# Patient Record
Sex: Female | Born: 1960 | Race: White | Hispanic: No | Marital: Married | Smoking: Current every day smoker
Health system: Southern US, Community
[De-identification: ages and names within clinical notes are randomized; demographics above are authoritative.]

## PROBLEM LIST (undated history)

## (undated) DIAGNOSIS — M1990 Unspecified osteoarthritis, unspecified site: Secondary | ICD-10-CM

## (undated) DIAGNOSIS — E785 Hyperlipidemia, unspecified: Secondary | ICD-10-CM

## (undated) DIAGNOSIS — I10 Essential (primary) hypertension: Secondary | ICD-10-CM

## (undated) DIAGNOSIS — F411 Generalized anxiety disorder: Secondary | ICD-10-CM

## (undated) DIAGNOSIS — E119 Type 2 diabetes mellitus without complications: Secondary | ICD-10-CM

## (undated) DIAGNOSIS — F329 Major depressive disorder, single episode, unspecified: Secondary | ICD-10-CM

## (undated) DIAGNOSIS — K5792 Diverticulitis of intestine, part unspecified, without perforation or abscess without bleeding: Secondary | ICD-10-CM

## (undated) DIAGNOSIS — K219 Gastro-esophageal reflux disease without esophagitis: Secondary | ICD-10-CM

## (undated) DIAGNOSIS — K2210 Ulcer of esophagus without bleeding: Secondary | ICD-10-CM

## (undated) DIAGNOSIS — F32A Depression, unspecified: Secondary | ICD-10-CM

## (undated) HISTORY — DX: Ulcer of esophagus without bleeding: K22.10

## (undated) HISTORY — DX: Gastro-esophageal reflux disease without esophagitis: K21.9

## (undated) HISTORY — DX: Diverticulitis of intestine, part unspecified, without perforation or abscess without bleeding: K57.92

## (undated) HISTORY — DX: Major depressive disorder, single episode, unspecified: F32.9

## (undated) HISTORY — DX: Generalized anxiety disorder: F41.1

## (undated) HISTORY — DX: Hyperlipidemia, unspecified: E78.5

## (undated) HISTORY — DX: Depression, unspecified: F32.A

## (undated) HISTORY — DX: Unspecified osteoarthritis, unspecified site: M19.90

---

## 1987-04-06 HISTORY — PX: GALLBLADDER SURGERY: SHX652

## 1992-04-05 HISTORY — PX: APPENDECTOMY: SHX54

## 2008-10-23 ENCOUNTER — Ambulatory Visit: Payer: Self-pay | Admitting: Unknown Physician Specialty

## 2009-11-26 ENCOUNTER — Ambulatory Visit: Payer: Self-pay | Admitting: Unknown Physician Specialty

## 2010-09-03 ENCOUNTER — Ambulatory Visit: Payer: Self-pay | Admitting: Unknown Physician Specialty

## 2010-10-26 ENCOUNTER — Ambulatory Visit: Payer: Self-pay | Admitting: Gastroenterology

## 2010-10-28 LAB — PATHOLOGY REPORT

## 2010-11-23 ENCOUNTER — Observation Stay: Payer: Self-pay | Admitting: Family Medicine

## 2010-11-30 ENCOUNTER — Ambulatory Visit: Payer: Self-pay | Admitting: Unknown Physician Specialty

## 2010-12-08 ENCOUNTER — Ambulatory Visit: Payer: Self-pay | Admitting: Gastroenterology

## 2010-12-14 ENCOUNTER — Ambulatory Visit: Payer: Self-pay | Admitting: Gastroenterology

## 2010-12-17 LAB — PATHOLOGY REPORT

## 2011-10-20 ENCOUNTER — Ambulatory Visit: Payer: Self-pay | Admitting: Unknown Physician Specialty

## 2012-01-14 ENCOUNTER — Ambulatory Visit: Payer: Self-pay | Admitting: Gastroenterology

## 2012-02-24 ENCOUNTER — Ambulatory Visit: Payer: Self-pay | Admitting: Obstetrics and Gynecology

## 2012-02-24 LAB — COMPREHENSIVE METABOLIC PANEL
Albumin: 4.2 g/dL (ref 3.4–5.0)
Anion Gap: 7 (ref 7–16)
BUN: 12 mg/dL (ref 7–18)
Calcium, Total: 9.1 mg/dL (ref 8.5–10.1)
Chloride: 103 mmol/L (ref 98–107)
EGFR (African American): >60
Glucose: 246 mg/dL — ABNORMAL HIGH (ref 65–99)
SGOT(AST): 54 U/L — ABNORMAL HIGH (ref 15–37)
SGPT (ALT): 57 U/L (ref 12–78)
Total Protein: 7.9 g/dL (ref 6.4–8.2)

## 2012-02-24 LAB — CBC
HGB: 12.7 g/dL (ref 12.0–16.0)
MCH: 32.7 pg (ref 26.0–34.0)
MCHC: 34.4 g/dL (ref 32.0–36.0)
MCV: 95 fL (ref 80–100)
Platelet: 181 10*3/uL (ref 150–440)
RBC: 3.88 10*6/uL (ref 3.80–5.20)
RDW: 12.7 % (ref 11.5–14.5)

## 2012-02-24 LAB — URINALYSIS, COMPLETE
Bacteria: NONE SEEN
Glucose,UR: >=500 mg/dL (ref 0–75)
Nitrite: NEGATIVE
Protein: NEGATIVE
RBC,UR: 1 /HPF (ref 0–5)
Specific Gravity: 1.003 (ref 1.003–1.030)
WBC UR: 2 /HPF (ref 0–5)

## 2012-02-24 LAB — PREGNANCY, URINE: Pregnancy Test, Urine: NEGATIVE m[IU]/mL

## 2012-03-07 ENCOUNTER — Ambulatory Visit: Payer: Self-pay | Admitting: Obstetrics and Gynecology

## 2012-03-07 LAB — HEMOGLOBIN: HGB: 10.6 g/dL — ABNORMAL LOW (ref 12.0–16.0)

## 2013-01-19 ENCOUNTER — Ambulatory Visit: Payer: Self-pay | Admitting: Physician Assistant

## 2013-01-26 ENCOUNTER — Ambulatory Visit: Payer: Self-pay | Admitting: Physician Assistant

## 2013-08-10 ENCOUNTER — Ambulatory Visit: Payer: Self-pay | Admitting: Physician Assistant

## 2014-07-23 NOTE — Op Note (Signed)
PATIENT NAME:  Morgan Small, Morgan Small MR#:  [id number] DATE OF BIRTH:  1960/12/22  DATE OF PROCEDURE:  03/07/2012  PREOPERATIVE DIAGNOSES:  1. Cystocele. 2. Rectocele. 3. Stress urinary incontinence.   POSTOPERATIVE DIAGNOSES: 1. Cystocele. 2. Rectocele. 3. Stress urinary incontinence.   PROCEDURES:  1. Anterior and posterior colporrhaphy. 2. Placement of transobturator vaginal sling.   ANESTHESIA: LMA.   SURGEON: Amiyah Shryock A. Patton SallesWeaver-Lee, MD   ASSISTANT: Alveria ApleyEryn Klipp, MD    ESTIMATED BLOOD LOSS: 850 mL.  OPERATIVE FLUIDS: 2 liters.   URINE OUTPUT: 400 mL.   COMPLICATIONS: None.   FINDINGS: As above.   SPECIMEN: None.   INDICATIONS: The patient is a [age] year old who presents with urinary incontinence symptoms. Urodynamics testing confirmed this finding clinically. The patient desired surgical management for the urinary incontinence. Additionally, she was found to have at least a grade II anterior and posterior vaginal wall prolapse for which she desired anterior and posterior repair. Risks, benefits, indications, and alternatives of the procedure were explained and informed consent was obtained.   PROCEDURE: The patient was taken to the operating room with IV fluids running. She was prepped and draped in the usual sterile fashion in candy-cane stirrups. A Foley catheter was placed for continuous drainage of the bladder.   The posterior repair was performed first in the following manner. 1% Sensorcaine with epinephrine 1:100,000 was injected for hydrodissection. Just inside the posterior fourchette of the vagina, a horizontal incision was made. A small vertical incision was made to begin the dissection. The vaginal mucosa was dissected off of the rectal mucosa carefully using Metzenbaum scissors. This was done in the midline with successive dissection and extension of the dissected area. Once the mucosa above the rectocele was exposed, Kelly plication sutures were placed using #0 Vicryl.  Excess vaginal mucosa was repaired posteriorly using a #2-0 Vicryl in a running locked fashion.   Attention was turned then to the patient'Small cystocele where the urethral region was grasped caudally using an Allis clamp. During this part of the procedure, the area was again injected with 1% Sensorcaine with epinephrine. A vertical incision was made and Allis clamps were successively placed as well as T clamps during the dissection of the bladder mucosa off of the vaginal mucosa. This was done both sharply and bluntly with moist Ray-Tec sponge. Additionally, the [facility] retractor system was used for further retraction and assistance with dissection of the area. Kelly plication sutures were again placed using #0 Vicryl anteriorly and the excess mucosa was trimmed. The anterior vaginal mucosa was then repaired with #2-0 Vicryl in running locked fashion. The midurethral region had been somewhat dissected during the anterior repair. An incision was further made in the midurethral region after hydrodissection using 1% Sensorcaine with epinephrine. The dissection down to the level of the ischial pubic ramus was carried out using the push and spread method using Metzenbaum scissors. Once the ischial pubic ramus was reached, the guide from the TVT urinary incontinence sling system was placed for help in guiding placement of the trocars. The trocar was placed first on the patient'Small right side according to the manufacturer'Small instructions and then carried through to a previously marked area on the patient'Small skin. The end of the trocar containing the trocar sheath was clamped and the trocar handle was reversed in order to remove the trocar from the sheath. This was repeated on the patient'Small left side. The sling was then brought into place using the mesh guide and cystoscopy was performed. No introduction  of the trocars into the bladder area were seen. The trocars were cut from the trocar sheath and further tightening of the  mesh against a right angle clamp was made. The mesh sheaths were grasped with a hemostat and carefully removed. The tightening guides within the mesh sheaths were then removed. The urethra was then repaired with a #2-0 Vicryl in a running locked fashion. Other areas that were found not to be hemostatic were repaired with a #2-0 and 3-0 Vicryl suture until hemostasis was obtained. The catheter was then replaced into the bladder and vaginal packing containing AVC cream was placed after all instrumentation was removed from the patient'Small vagina.   The patient tolerated the procedure well. Sponge, needle, and instrument counts were correct x2. The patient was awakened from anesthesia and taken to the recovery room in stable condition.   ____________________________ Sonda Primes Patton Salles, MD law:drc D: 03/07/2012 15:43:00 ET T: 03/07/2012 16:29:32 ET JOB#: [id number]  cc: Flint Melter A. Patton Salles, MD, <Dictator> Sonda Primes WEAVER LEE MD ELECTRONICALLY SIGNED 03/08/2012 8:03

## 2015-01-08 ENCOUNTER — Other Ambulatory Visit: Payer: Self-pay | Admitting: Physician Assistant

## 2015-01-08 ENCOUNTER — Ambulatory Visit
Admission: RE | Admit: 2015-01-08 | Discharge: 2015-01-08 | Disposition: A | Discharge status: Home / Self Care | Payer: 59 | Source: Ambulatory Visit | Attending: Physician Assistant | Admitting: Physician Assistant

## 2015-01-08 DIAGNOSIS — R1013 Epigastric pain: Secondary | ICD-10-CM | POA: Insufficient documentation

## 2015-01-08 DIAGNOSIS — R110 Nausea: Secondary | ICD-10-CM

## 2015-01-08 DIAGNOSIS — K760 Fatty (change of) liver, not elsewhere classified: Secondary | ICD-10-CM | POA: Insufficient documentation

## 2015-01-08 DIAGNOSIS — R1084 Generalized abdominal pain: Secondary | ICD-10-CM | POA: Diagnosis present

## 2015-01-08 DIAGNOSIS — R162 Hepatomegaly with splenomegaly, not elsewhere classified: Secondary | ICD-10-CM | POA: Diagnosis not present

## 2015-01-08 DIAGNOSIS — K5730 Diverticulosis of large intestine without perforation or abscess without bleeding: Secondary | ICD-10-CM | POA: Diagnosis not present

## 2015-01-08 DIAGNOSIS — K921 Melena: Secondary | ICD-10-CM | POA: Diagnosis present

## 2015-01-08 HISTORY — DX: Essential (primary) hypertension: I10

## 2015-01-08 HISTORY — DX: Type 2 diabetes mellitus without complications: E11.9

## 2015-01-08 MED ORDER — IOHEXOL 300 MG/ML  SOLN
100.0000 mL | Freq: Once | INTRAMUSCULAR | Status: AC | PRN
  Administered 2015-01-08: 100 mL via INTRAVENOUS

## 2015-01-14 ENCOUNTER — Emergency Department
Admission: EM | Admit: 2015-01-14 | Discharge: 2015-01-14 | Disposition: A | Discharge status: Home / Self Care | Payer: 59 | Attending: Emergency Medicine | Admitting: Emergency Medicine

## 2015-01-14 ENCOUNTER — Encounter: Payer: Self-pay | Admitting: Emergency Medicine

## 2015-01-14 DIAGNOSIS — I10 Essential (primary) hypertension: Secondary | ICD-10-CM | POA: Insufficient documentation

## 2015-01-14 DIAGNOSIS — G8929 Other chronic pain: Secondary | ICD-10-CM | POA: Insufficient documentation

## 2015-01-14 DIAGNOSIS — E119 Type 2 diabetes mellitus without complications: Secondary | ICD-10-CM | POA: Insufficient documentation

## 2015-01-14 DIAGNOSIS — R109 Unspecified abdominal pain: Secondary | ICD-10-CM | POA: Diagnosis present

## 2015-01-14 DIAGNOSIS — Z720 Tobacco use: Secondary | ICD-10-CM | POA: Insufficient documentation

## 2015-01-14 DIAGNOSIS — N390 Urinary tract infection, site not specified: Secondary | ICD-10-CM | POA: Insufficient documentation

## 2015-01-14 DIAGNOSIS — R1084 Generalized abdominal pain: Secondary | ICD-10-CM

## 2015-01-14 LAB — URINALYSIS COMPLETE WITH MICROSCOPIC (ARMC ONLY)
Bilirubin Urine: NEGATIVE
Glucose, UA: NEGATIVE mg/dL
Hgb urine dipstick: NEGATIVE
Ketones, ur: NEGATIVE mg/dL
Nitrite: NEGATIVE
PROTEIN: NEGATIVE mg/dL
SPECIFIC GRAVITY, URINE: 1.02 (ref 1.005–1.030)
pH: 5 (ref 5.0–8.0)

## 2015-01-14 LAB — CBC WITH DIFFERENTIAL/PLATELET
BASOS ABS: 0.1 10*3/uL (ref 0–0.1)
BASOS PCT: 1 %
Eosinophils Absolute: 1 10*3/uL — ABNORMAL HIGH (ref 0–0.7)
Eosinophils Relative: 11 %
HEMATOCRIT: 38.1 % (ref 35.0–47.0)
HEMOGLOBIN: 12.7 g/dL (ref 12.0–16.0)
LYMPHS PCT: 17 %
Lymphs Abs: 1.7 10*3/uL (ref 1.0–3.6)
MCH: 28.4 pg (ref 26.0–34.0)
MCHC: 33.2 g/dL (ref 32.0–36.0)
MCV: 85.6 fL (ref 80.0–100.0)
MONO ABS: 0.8 10*3/uL (ref 0.2–0.9)
Monocytes Relative: 8 %
NEUTROS ABS: 6 10*3/uL (ref 1.4–6.5)
NEUTROS PCT: 63 %
Platelets: 168 10*3/uL (ref 150–440)
RBC: 4.45 MIL/uL (ref 3.80–5.20)
RDW: 16 % — ABNORMAL HIGH (ref 11.5–14.5)
WBC: 9.6 10*3/uL (ref 3.6–11.0)

## 2015-01-14 LAB — COMPREHENSIVE METABOLIC PANEL
ALBUMIN: 4.3 g/dL (ref 3.5–5.0)
ALK PHOS: 62 U/L (ref 38–126)
ALT: 36 U/L (ref 14–54)
AST: 48 U/L — AB (ref 15–41)
Anion gap: 7 (ref 5–15)
BILIRUBIN TOTAL: 0.8 mg/dL (ref 0.3–1.2)
BUN: 15 mg/dL (ref 6–20)
CALCIUM: 9.2 mg/dL (ref 8.9–10.3)
CO2: 24 mmol/L (ref 22–32)
CREATININE: 1.23 mg/dL — AB (ref 0.44–1.00)
Chloride: 105 mmol/L (ref 101–111)
GFR calc Af Amer: 56 mL/min — ABNORMAL LOW (ref >60)
GFR, EST NON AFRICAN AMERICAN: 48 mL/min — AB (ref >60)
GLUCOSE: 137 mg/dL — AB (ref 65–99)
Potassium: 3.8 mmol/L (ref 3.5–5.1)
Sodium: 136 mmol/L (ref 135–145)
TOTAL PROTEIN: 7.7 g/dL (ref 6.5–8.1)

## 2015-01-14 LAB — LIPASE, BLOOD: LIPASE: 18 U/L — AB (ref 22–51)

## 2015-01-14 LAB — TROPONIN I: Troponin I: <0.03 ng/mL (ref <0.031)

## 2015-01-14 MED ORDER — CEPHALEXIN 500 MG PO CAPS: 500.0000 mg | ORAL_CAPSULE | Freq: Two times a day (BID) | ORAL | Status: DC

## 2015-01-14 MED ORDER — CEPHALEXIN 500 MG PO CAPS
500.0000 mg | ORAL_CAPSULE | Freq: Once | ORAL | Status: AC
  Administered 2015-01-14: 500 mg via ORAL
  Filled 2015-01-14: qty 1

## 2015-01-14 NOTE — ED Provider Notes (Signed)
[facility] Emergency Department Provider Note  ____________________________________________  Time seen: Approximately 3:57 PM  I have reviewed the triage vital signs and the nursing notes.   HISTORY  Chief Complaint Abdominal Pain    HPI Morgan Small is a 54 y.o. female with a history of chronic abdominal pain, diverticulosis, and acid reflux who saw Dr. Bluford Kaufmann last week and was started on Flagyl for an apparent enteritis on her CT scan obtained less than one week ago.  She presents today not with any acute pain or worsening of her discomfort but just because of persistent pain and discomfort after 6 days on the Flagyl.  She describes the pain as burning, worse after eating and when lying flat, and throughout her entire abdomen but worse up high.  She also has a feeling of distention.  She denies nausea and vomiting.  She denies chest pain or shortness of breath.  She denies fever/chills.  She denies diarrhea and constipation.  She states that her symptoms are severe at the worst but currently they are "not bad".  In addition to the Flagyl she is also taking a PPI.  She is a current every day smoker but denies alcohol use.   Past Medical History  Diagnosis Date  . Diabetes mellitus without complication (HCC)   . Hypertension     There are no active problems to display for this patient.   History reviewed. No pertinent past surgical history.  Current Outpatient Rx  Name  Route  Sig  Dispense  Refill  . cephALEXin (KEFLEX) 500 MG capsule   Oral   Take 1 capsule (500 mg total) by mouth 2 (two) times daily.   14 capsule   0     Allergies Review of patient's allergies indicates no known allergies.  No family history on file.  Social History Social History  Substance Use Topics  . Smoking status: Current Every Day Smoker  . Smokeless tobacco: None  . Alcohol Use: No    Review of Systems Constitutional: No fever/chills Eyes: No visual  changes. ENT: No sore throat. Cardiovascular: Denies chest pain. Respiratory: Denies shortness of breath. Gastrointestinal: Chronic burning abdominal pain and distention worse in the upper abdomen.  No nausea, no vomiting.  No diarrhea.  No constipation. Genitourinary: Negative for dysuria. Musculoskeletal: Negative for back pain. Skin: Negative for rash. Neurological: Negative for headaches, focal weakness or numbness.  10-point ROS otherwise negative.  ____________________________________________   PHYSICAL EXAM:  VITAL SIGNS: ED Triage Vitals  Enc Vitals Group     BP 01/14/15 1532 152/65 mmHg     Pulse Rate 01/14/15 1532 102     Resp 01/14/15 1532 20     Temp 01/14/15 1532 99 F (37.2 C)     Temp Source 01/14/15 1532 Oral     SpO2 01/14/15 1532 96 %     Weight 01/14/15 1532 200 lb (90.719 kg)     Height 01/14/15 1532  (1.676 m)     Head Cir --      Peak Flow --      Pain Score 01/14/15 1533 7     Pain Loc --      Pain Edu? --      Excl. in GC? --     Constitutional: Alert and oriented. Well appearing and in no acute distress. Eyes: Conjunctivae are normal. PERRL. EOMI. Head: Atraumatic. Nose: No congestion/rhinnorhea. Mouth/Throat: Mucous membranes are moist.  Oropharynx non-erythematous. Neck: No stridor.   Cardiovascular:  Normal rate, regular rhythm. Grossly normal heart sounds.  Good peripheral circulation. Respiratory: Normal respiratory effort.  No retractions. Lungs CTAB. Gastrointestinal: Soft with very mild generalized tenderness to palpation.  Mild distention. No abdominal bruits. No CVA tenderness. Musculoskeletal: No lower extremity tenderness nor edema.  No joint effusions. Neurologic:  Normal speech and language. No gross focal neurologic deficits are appreciated.  Skin:  Skin is warm, dry and intact. No rash noted. Psychiatric: Mood and affect are normal. Speech and behavior are normal.  ____________________________________________    LABS (all labs ordered are listed, but only abnormal results are displayed)  Labs Reviewed  CBC WITH DIFFERENTIAL/PLATELET - Abnormal; Notable for the following:    RDW 16.0 (*)    Eosinophils Absolute 1.0 (*)    All other components within normal limits  COMPREHENSIVE METABOLIC PANEL - Abnormal; Notable for the following:    Glucose, Bld 137 (*)    Creatinine, Ser 1.23 (*)    AST 48 (*)    GFR calc non Af Amer 48 (*)    GFR calc Af Amer 56 (*)    All other components within normal limits  LIPASE, BLOOD - Abnormal; Notable for the following:    Lipase 18 (*)    All other components within normal limits  URINALYSIS COMPLETEWITH MICROSCOPIC ([facility] ONLY) - Abnormal; Notable for the following:    Color, Urine YELLOW (*)    APPearance CLEAR (*)    Leukocytes, UA 2+ (*)    Bacteria, UA RARE (*)    Squamous Epithelial / LPF 6-30 (*)    All other components within normal limits  URINE CULTURE  TROPONIN I   ____________________________________________  EKG  Not indicated ____________________________________________  RADIOLOGY  (CT ordered as outpatient from less than 1 week ago) Ct Abdomen Pelvis W Contrast  01/08/2015   CLINICAL DATA:  Three week history of epigastric abdominal pain, abdominal bloating, nausea, and bloody stools. Patient describes a fall approximately 2 weeks ago when she struck the left side of her body.  EXAM: CT ABDOMEN AND PELVIS WITH CONTRAST  TECHNIQUE: Multidetector CT imaging of the abdomen and pelvis was performed using the standard protocol following bolus administration of intravenous contrast.  CONTRAST:  OMNIPAQUE IOHEXOL 300 MG/ML IV. Oral contrast was also administered.  COMPARISON:  01/14/2012, 12/08/2010.  FINDINGS: Hepatobiliary: Moderate to marked hepatomegaly. Relative enlargement of the left lobe and caudate lobe. Diffuse hepatic steatosis without focal hepatic parenchymal abnormality. Patent portal vein. Gallbladder surgically absent. No  unexpected biliary ductal dilation.  Spleen: Enlarged, measuring approximately 12.5 x 10.4 x 12.3 cm, yielding a volume of approximately 800 ml. No focal splenic parenchymal abnormality.  Pancreas:  Moderate atrophy without focal parenchymal abnormality.  Adrenal glands:  Normal in appearance.  Genitourinary: Both kidneys normal in size and appearance. No evidence of urinary tract calculi. Normal-appearing urinary bladder.  Normal-appearing uterus and ovaries without evidence of adnexal mass or free pelvic fluid.  Gastrointestinal: Stomach normal in appearance for degree of distention. Mild thickening of the wall of the terminal ileum over several cm segment. Small bowel otherwise normal in appearance. Lipoma involving the ileocecal valve. Mild wall thickening involving the entire colon with the exception of the rectum. Large rectal stool burden. Liquid stool present elsewhere in the colon. Descending and sigmoid colon diverticulosis without evidence of acute diverticulitis. Appendix presumed surgically absent.  Ascites:  Absent.  Vascular: Moderate aortoiliac atherosclerosis without evidence of aneurysm. Patent visceral arteries.  Lymphatic: Mildly enlarged gastrohepatic ligament lymph nodes measuring  in total approximately 2.1 x 4.0 x 1.3 cm. Mildly enlarged portacaval lymph nodes measuring in total approximately 1.7 x 2.5 x 3.2 cm. Scattered normal size lymph nodes elsewhere in the retroperitoneum and mesentery.  Other findings: Decrease in size of the previously identified sebaceous cyst involving the skin of the anterior inferior pelvis to the right of midline.  Musculoskeletal: Mild degenerative disc disease and spondylosis involving the lower thoracic spine and at L3-4.  Visualized lower thorax: Heart size normal. Prominent epicardial fat as noted previously. Visualized lung bases clear.  IMPRESSION: 1. Wall thickening involving a several cm segment of the terminal ileum and wall thickening involving the  colon from the cecum to the distal sigmoid colon with sparing of the rectum. Differential diagnosis includes infectious enterocolitis and inflammatory bowel disease (ulcerative colitis with backwash ileitis). 2. Hepatosplenomegaly. Diffuse hepatic steatosis. Query early hepatic cirrhosis. 3. Descending and sigmoid colon diverticulosis without evidence of acute diverticulitis. 4. Likely reactive mildly enlarged gastrohepatic ligament and portacaval lymph nodes. No pathologic lymphadenopathy elsewhere.   Electronically Signed   By: Hulan Saas M.D.   On: 01/08/2015 13:11     ____________________________________________   PROCEDURES  Procedure(s) performed: None  Critical Care performed: No ____________________________________________   INITIAL IMPRESSION / ASSESSMENT AND PLAN / ED COURSE  Pertinent labs & imaging results that were available during my care of the patient were reviewed by me and considered in my medical decision making (see chart for details).  The patient states that she does not have any acute or new pain or other symptoms today.  She states that she called her GI doctor today and was told to come to the emergency department, but it is unclear what sort of workup or plan they had in mind.  She was slightly tachycardic upon arrival but her pulse was taken immediately after she walked in.  She appears very stable and in no acute distress at this time.  I will do a standard abdominal pain workup but will defer any imaging at this time even that she had a CT scan performed less than one week ago.  I am attempting to contact Dr. Bluford Kaufmann at his clinic given that it is still during clinic hours so that we can discuss further management.   (Note that documentation was delayed due to multiple ED patients requiring immediate care.)   I spoke by phone with Dr. Mechele Collin, who is taking call for Dr. Bluford Kaufmann.  We discussed the patient's unremarkable workup and Dr. Mechele Collin agreed that there is no  other emergent intervention recommended at this time.  The patient will call the clinic in the morning to schedule a follow-up appointment.  The patient is comfortable with this plan. I am also treating for an uncomplicated UTI.  ____________________________________________  FINAL CLINICAL IMPRESSION(S) / ED DIAGNOSES  Final diagnoses:  Generalized abdominal pain  UTI (urinary tract infection), uncomplicated      NEW MEDICATIONS STARTED DURING THIS VISIT:  Discharge Medication List as of 01/14/2015  6:45 PM    START taking these medications   Details  cephALEXin (KEFLEX) 500 MG capsule Take 1 capsule (500 mg total) by mouth 2 (two) times daily., Starting 01/14/2015, Until Discontinued, Print         Loleta Rose, MD 01/14/15 [phone]

## 2015-01-14 NOTE — Discharge Instructions (Signed)
You have been seen in the Emergency Department (ED) for abdominal pain.  Your evaluation did not identify a clear cause of your symptoms but was generally reassuring. ° °Please follow up as instructed above regarding today’s emergent visit and the symptoms that are bothering you. ° °Return to the ED if your abdominal pain worsens or fails to improve, you develop bloody vomiting, bloody diarrhea, you are unable to tolerate fluids due to vomiting, fever greater than 101, or other symptoms that concern you. ° ° °Abdominal Pain, Adult °Many things can cause abdominal pain. Usually, abdominal pain is not caused by a disease and will improve without treatment. It can often be observed and treated at home. Your health care provider will do a physical exam and possibly order blood tests and X-rays to help determine the seriousness of your pain. However, in many cases, more time must pass before a clear cause of the pain can be found. Before that point, your health care provider may not know if you need more testing or further treatment. °HOME CARE INSTRUCTIONS °Monitor your abdominal pain for any changes. The following actions may help to alleviate any discomfort you are experiencing: °· Only take over-the-counter or prescription medicines as directed by your health care provider. °· Do not take laxatives unless directed to do so by your health care provider. °· Try a clear liquid diet (broth, tea, or water) as directed by your health care provider. Slowly move to a bland diet as tolerated. °SEEK MEDICAL CARE IF: °· You have unexplained abdominal pain. °· You have abdominal pain associated with nausea or diarrhea. °· You have pain when you urinate or have a bowel movement. °· You experience abdominal pain that wakes you in the night. °· You have abdominal pain that is worsened or improved by eating food. °· You have abdominal pain that is worsened with eating fatty foods. °· You have a fever. °SEEK IMMEDIATE MEDICAL CARE  IF: °· Your pain does not go away within 2 hours. °· You keep throwing up (vomiting). °· Your pain is felt only in portions of the abdomen, such as the right side or the left lower portion of the abdomen. °· You pass bloody or black tarry stools. °MAKE SURE YOU: °· Understand these instructions. °· Will watch your condition. °· Will get help right away if you are not doing well or get worse. °  °This information is not intended to replace advice given to you by your health care provider. Make sure you discuss any questions you have with your health care provider. °  °Document Released: 12/30/2004 Document Revised: 12/11/2014 Document Reviewed: 11/29/2012 °Elsevier Interactive Patient Education ©2016 [employer] ° °

## 2015-01-14 NOTE — ED Notes (Signed)
Having abd pain w/o nausea or vomiting. No fever   Describes pain as burning.  Has appt for endo and colonoscopy this mo.

## 2015-01-14 NOTE — ED Notes (Signed)
Pt reports abd pain for the past week that worsens with foot and improves when lying flat. Pt has hx of diverticulitis and GERD but reports this feels different than both of those.

## 2015-01-16 LAB — URINE CULTURE: SPECIAL REQUESTS: NORMAL

## 2015-02-03 ENCOUNTER — Encounter
Admission: RE | Disposition: A | Discharge status: Home / Self Care | Payer: Self-pay | Source: Ambulatory Visit | Attending: Gastroenterology

## 2015-02-03 ENCOUNTER — Ambulatory Visit: Payer: 59 | Admitting: Anesthesiology

## 2015-02-03 ENCOUNTER — Encounter: Payer: Self-pay | Admitting: Anesthesiology

## 2015-02-03 ENCOUNTER — Ambulatory Visit
Admission: RE | Admit: 2015-02-03 | Discharge: 2015-02-03 | Disposition: A | Discharge status: Home / Self Care | Payer: 59 | Source: Ambulatory Visit | Attending: Gastroenterology | Admitting: Gastroenterology

## 2015-02-03 DIAGNOSIS — R112 Nausea with vomiting, unspecified: Secondary | ICD-10-CM | POA: Insufficient documentation

## 2015-02-03 DIAGNOSIS — E119 Type 2 diabetes mellitus without complications: Secondary | ICD-10-CM | POA: Insufficient documentation

## 2015-02-03 DIAGNOSIS — K5730 Diverticulosis of large intestine without perforation or abscess without bleeding: Secondary | ICD-10-CM | POA: Insufficient documentation

## 2015-02-03 DIAGNOSIS — K760 Fatty (change of) liver, not elsewhere classified: Secondary | ICD-10-CM | POA: Insufficient documentation

## 2015-02-03 DIAGNOSIS — I10 Essential (primary) hypertension: Secondary | ICD-10-CM | POA: Diagnosis not present

## 2015-02-03 DIAGNOSIS — K219 Gastro-esophageal reflux disease without esophagitis: Secondary | ICD-10-CM | POA: Insufficient documentation

## 2015-02-03 DIAGNOSIS — F17210 Nicotine dependence, cigarettes, uncomplicated: Secondary | ICD-10-CM | POA: Diagnosis not present

## 2015-02-03 DIAGNOSIS — R1030 Lower abdominal pain, unspecified: Secondary | ICD-10-CM | POA: Insufficient documentation

## 2015-02-03 HISTORY — PX: COLONOSCOPY WITH PROPOFOL: SHX5780

## 2015-02-03 HISTORY — PX: ESOPHAGOGASTRODUODENOSCOPY (EGD) WITH PROPOFOL: SHX5813

## 2015-02-03 LAB — GLUCOSE, CAPILLARY: GLUCOSE-CAPILLARY: 115 mg/dL — AB (ref 65–99)

## 2015-02-03 SURGERY — COLONOSCOPY WITH PROPOFOL
Anesthesia: General

## 2015-02-03 MED ORDER — GLYCOPYRROLATE 0.2 MG/ML IJ SOLN
INTRAMUSCULAR | Status: DC | PRN
  Administered 2015-02-03: 0.2 mg via INTRAVENOUS

## 2015-02-03 MED ORDER — PROPOFOL 500 MG/50ML IV EMUL
INTRAVENOUS | Status: DC | PRN
  Administered 2015-02-03: 150 ug/kg/min via INTRAVENOUS

## 2015-02-03 MED ORDER — SODIUM CHLORIDE 0.9 % IV SOLN: INTRAVENOUS | Status: DC

## 2015-02-03 MED ORDER — PROPOFOL 10 MG/ML IV BOLUS
INTRAVENOUS | Status: DC | PRN
  Administered 2015-02-03: 100 mg via INTRAVENOUS
  Administered 2015-02-03: 40 mg via INTRAVENOUS

## 2015-02-03 MED ORDER — SODIUM CHLORIDE 0.9 % IV SOLN
INTRAVENOUS | Status: DC
  Administered 2015-02-03: 1000 mL via INTRAVENOUS

## 2015-02-03 MED ORDER — PHENYLEPHRINE HCL 10 MG/ML IJ SOLN
INTRAMUSCULAR | Status: DC | PRN
Start: 2015-02-03 — End: 2015-02-03
  Administered 2015-02-03: 200 ug via INTRAVENOUS

## 2015-02-03 MED ORDER — LIDOCAINE HCL (CARDIAC) 20 MG/ML IV SOLN
INTRAVENOUS | Status: DC | PRN
  Administered 2015-02-03: 100 mg via INTRAVENOUS

## 2015-02-03 NOTE — Transfer of Care (Signed)
Immediate Anesthesia Transfer of Care Note  Patient: Morgan Small  Procedure(s) Performed: Procedure(s): COLONOSCOPY WITH PROPOFOL (N/A) ESOPHAGOGASTRODUODENOSCOPY (EGD) WITH PROPOFOL (N/A)  Patient Location: Endoscopy Unit  Anesthesia Type:General  Level of Consciousness: sedated  Airway & Oxygen Therapy: Patient Spontanous Breathing and Patient connected to nasal cannula oxygen  Post-op Assessment: Report given to RN and Post -op Vital signs reviewed and stable  Post vital signs: Reviewed and stable  Last Vitals:  Filed Vitals:   02/03/15 1112  BP: 91/51  Pulse: 79  Temp: 36 C  Resp: 20    Complications: No apparent anesthesia complications

## 2015-02-03 NOTE — Anesthesia Preprocedure Evaluation (Signed)
Anesthesia Evaluation  Patient identified by MRN, date of birth, ID band Patient awake    Reviewed: Allergy & Precautions, H&P , NPO status , Patient's Chart, lab work & pertinent test results, reviewed documented beta blocker date and time   History of Anesthesia Complications Negative for: history of anesthetic complications  Airway Mallampati: I  TM Distance: >3 FB Neck ROM: full    Dental no notable dental hx. (+) Edentulous Upper, Edentulous Lower   Pulmonary neg shortness of breath, neg sleep apnea, neg COPD, neg recent URI, Current Smoker,    Pulmonary exam normal breath sounds clear to auscultation       Cardiovascular Exercise Tolerance: Good hypertension, On Medications (-) angina(-) CAD, (-) Past MI, (-) Cardiac Stents and (-) CABG Normal cardiovascular exam(-) dysrhythmias (-) Valvular Problems/Murmurs Rhythm:regular Rate:Normal     Neuro/Psych negative neurological ROS  negative psych ROS   GI/Hepatic GERD  Medicated and Poorly Controlled,Fatty liver disease   Endo/Other  diabetes, Well Controlled, Oral Hypoglycemic Agents  Renal/GU negative Renal ROS  negative genitourinary   Musculoskeletal   Abdominal   Peds  Hematology negative hematology ROS (+)   Anesthesia Other Findings Past Medical History:   Diabetes mellitus without complication (HCC)                 Hypertension                                                 Reproductive/Obstetrics negative OB ROS                             Anesthesia Physical Anesthesia Plan  ASA: III  Anesthesia Plan: General   Post-op Pain Management:    Induction:   Airway Management Planned:   Additional Equipment:   Intra-op Plan:   Post-operative Plan:   Informed Consent: I have reviewed the patients History and Physical, chart, labs and discussed the procedure including the risks, benefits and alternatives for the  proposed anesthesia with the patient or authorized representative who has indicated his/her understanding and acceptance.   Dental Advisory Given  Plan Discussed with: Anesthesiologist, CRNA and Surgeon  Anesthesia Plan Comments:         Anesthesia Quick Evaluation

## 2015-02-03 NOTE — H&P (Signed)
  Date of Initial H&P:01/09/2015  History reviewed, patient examined, no change in status, stable for surgery.

## 2015-02-03 NOTE — Op Note (Signed)
[facility] Gastroenterology Patient Name: Morgan Small Procedure Date: 02/03/2015 10:40 AM MRN: [id number] Account #: [id number] Date of Birth: 28-Jul-1960 Admit Type: Outpatient Age: 54 Room: [facility] ENDO ROOM 4 Gender: Female Note Status: Finalized Procedure:         Upper GI endoscopy Indications:       Nausea with vomiting, Sxs have resolved Providers:         Ezzard Standing. Bluford Kaufmann, MD Referring MD:      Marilynne Halsted, MD (Referring MD) Medicines:         Monitored Anesthesia Care Complications:     No immediate complications. Procedure:         Pre-Anesthesia Assessment:                    - Prior to the procedure, a History and Physical was                     performed, and patient medications, allergies and                     sensitivities were reviewed. The patient's tolerance of                     previous anesthesia was reviewed.                    - The risks and benefits of the procedure and the sedation                     options and risks were discussed with the patient. All                     questions were answered and informed consent was obtained.                    - After reviewing the risks and benefits, the patient was                     deemed in satisfactory condition to undergo the procedure.                    After obtaining informed consent, the endoscope was passed                     under direct vision. Throughout the procedure, the                     patient's blood pressure, pulse, and oxygen saturations                     were monitored continuously. The Olympus GIF-160 endoscope                     (S#. [id number]) was introduced through the mouth, and                     advanced to the second part of duodenum. The upper GI                     endoscopy was accomplished without difficulty. The patient                     tolerated the procedure well. Findings:      The examined esophagus was normal.  The entire examined  stomach was normal.      The examined duodenum was normal. Impression:        - Normal esophagus.                    - Normal stomach.                    - Normal examined duodenum.                    - No specimens collected. Recommendation:    - Discharge patient to home.                    - Observe patient's clinical course.                    - The findings and recommendations were discussed with the                     patient. Procedure Code(s): --- Professional ---                    [phone], Esophagogastroduodenoscopy, flexible, transoral;                     diagnostic, including collection of specimen(s) by                     brushing or washing, when performed (separate procedure) Diagnosis Code(s): --- Professional ---                    R11.2, Nausea with vomiting, unspecified CPT copyright 2014 American Medical Association. All rights reserved. The codes documented in this report are preliminary and upon coder review may  be revised to meet current compliance requirements. Wallace CullensPaul Y Zacary Bauer, MD 02/03/2015 10:47:30 AM This report has been signed electronically. Number of Addenda: 0 Note Initiated On: 02/03/2015 10:40 AM      [facility]

## 2015-02-03 NOTE — Op Note (Signed)
[facility]enter[facility] Patient Name: Morgan Small Procedure Date: 02/03/2015 10:39 AM MRN: [id number] Account #: [id number] Date of Birth: Aug 20, 1960 Admit Type: Outpatient Age: 54 Room[facility] END[facility] ROOM 4 Gender: Female Note Status: Finalized Procedure:         Colonoscopy Indications:       Rectal bleeding, Abnormal CT of the GI tract, Lower                     abdominal pain Providers:         Ezzard StandingPaul Y. Bluford Kaufmannh, MD Referring MD:      Marilynne HalstedMiriam J. Mclaughlin, MD (Referring MD) Medicines:         Monitored Anesthesia Care Complications:     No immediate complications. Procedure:         Pre-Anesthesia Assessment:                    - Prior to the procedure, a History and Physical was                     performed, and patient medications, allergies and                     sensitivities were reviewed. The patient's tolerance of                     previous anesthesia was reviewed.                    - The risks and benefits of the procedure and the sedation                     options and risks were discussed with the patient. All                     questions were answered and informed consent was obtained.                    - After reviewing the risks and benefits, the patient was                     deemed in satisfactory condition to undergo the procedure.                    After obtaining informed consent, the colonoscope was                     passed under direct vision. Throughout the procedure, the                     patient's blood pressure, pulse, and oxygen saturations                     were monitored continuously. The Colonoscope was                     introduced through the anus and advanced to the the cecum,                     identified by appendiceal orifice and ileocecal valve. The                     patient tolerated the procedure well. The colonoscopy was  performed with difficulty due to restricted mobility of                      the colon. The quality of the bowel preparation was fair. Findings:      Multiple small and large-mouthed diverticula were found in the sigmoid       colon and in the descending colon.      The exam was otherwise without abnormality.      Unable to intubate TI today.      The exam was otherwise without abnormality. Impression:        - Diverticulosis in the sigmoid colon and in the                     descending colon.                    - The examination was otherwise normal.                    - The examination was otherwise normal.                    - No specimens collected.                    - Probably had infectious colitis, that appeared to have                     resolved. Recommendation:    - Discharge patient to home.                    - Repeat colonoscopy in 10 years for surveillance.                    - The findings and recommendations were discussed with the                     patient. Procedure Code(s): --- Professional ---                    [phone], Colonoscopy, flexible; diagnostic, including                     collection of specimen(s) by brushing or washing, when                     performed (separate procedure) Diagnosis Code(s): --- Professional ---                    K62.5, Hemorrhage of anus and rectum                    R10.30, Lower abdominal pain, unspecified                    K57.30, Diverticulosis of large intestine without                     perforation or abscess without bleeding                    R93.3, Abnormal findings on diagnostic imaging of other                     parts of digestive tract CPT copyright 2014 American Medical Association. All rights reserved. The codes documented in this report are preliminary and upon coder  review may  be revised to meet current compliance requirements. Wallace Cullens, MD 02/03/2015 11:12:14 AM This report has been signed electronically. Number of Addenda: 0 Note Initiated On: 02/03/2015 10:39  AM Scope Withdrawal Time: 0 hours 6 minutes 21 seconds  Total Procedure Duration: 0 hours 18 minutes 4 seconds       [facility]

## 2015-02-03 NOTE — Anesthesia Postprocedure Evaluation (Signed)
  Anesthesia Post-op Note  Patient: Morgan Small  Procedure(s) Performed: Procedure(s): COLONOSCOPY WITH PROPOFOL (N/A) ESOPHAGOGASTRODUODENOSCOPY (EGD) WITH PROPOFOL (N/A)  Anesthesia type:General  Patient location: PACU  Post pain: Pain level controlled  Post assessment: Post-op Vital signs reviewed, Patient's Cardiovascular Status Stable, Respiratory Function Stable, Patent Airway and No signs of Nausea or vomiting  Post vital signs: Reviewed and stable  Last Vitals:  Filed Vitals:   02/03/15 1140  BP: 122/65  Pulse: 67  Temp:   Resp: 15    Level of consciousness: awake, alert  and patient cooperative  Complications: No apparent anesthesia complications

## 2015-02-04 ENCOUNTER — Encounter: Payer: Self-pay | Admitting: Gastroenterology

## 2015-06-23 ENCOUNTER — Other Ambulatory Visit: Payer: Self-pay | Admitting: Physician Assistant

## 2015-06-23 ENCOUNTER — Ambulatory Visit
Admission: RE | Admit: 2015-06-23 | Discharge: 2015-06-23 | Disposition: A | Discharge status: Home / Self Care | Payer: 59 | Source: Ambulatory Visit | Attending: Physician Assistant | Admitting: Physician Assistant

## 2015-06-23 DIAGNOSIS — K529 Noninfective gastroenteritis and colitis, unspecified: Secondary | ICD-10-CM

## 2015-06-23 DIAGNOSIS — K760 Fatty (change of) liver, not elsewhere classified: Secondary | ICD-10-CM | POA: Diagnosis not present

## 2015-06-23 DIAGNOSIS — R162 Hepatomegaly with splenomegaly, not elsewhere classified: Secondary | ICD-10-CM | POA: Insufficient documentation

## 2015-06-23 DIAGNOSIS — R1084 Generalized abdominal pain: Secondary | ICD-10-CM | POA: Diagnosis not present

## 2015-06-23 DIAGNOSIS — K5730 Diverticulosis of large intestine without perforation or abscess without bleeding: Secondary | ICD-10-CM | POA: Diagnosis not present

## 2015-06-23 DIAGNOSIS — R1110 Vomiting, unspecified: Secondary | ICD-10-CM

## 2015-06-23 DIAGNOSIS — R945 Abnormal results of liver function studies: Secondary | ICD-10-CM

## 2015-06-23 DIAGNOSIS — R590 Localized enlarged lymph nodes: Secondary | ICD-10-CM | POA: Insufficient documentation

## 2015-06-23 LAB — POCT I-STAT CREATININE: Creatinine, Ser: 1 mg/dL (ref 0.44–1.00)

## 2015-06-23 MED ORDER — IOHEXOL 300 MG/ML  SOLN
100.0000 mL | Freq: Once | INTRAMUSCULAR | Status: AC | PRN
  Administered 2015-06-23: 100 mL via INTRAVENOUS

## 2015-08-06 ENCOUNTER — Other Ambulatory Visit: Payer: Self-pay | Admitting: Physician Assistant

## 2015-08-06 DIAGNOSIS — R921 Mammographic calcification found on diagnostic imaging of breast: Secondary | ICD-10-CM

## 2015-08-22 ENCOUNTER — Ambulatory Visit
Admission: RE | Admit: 2015-08-22 | Discharge: 2015-08-22 | Disposition: A | Discharge status: Home / Self Care | Payer: 59 | Source: Ambulatory Visit | Attending: Physician Assistant | Admitting: Physician Assistant

## 2015-08-22 DIAGNOSIS — R921 Mammographic calcification found on diagnostic imaging of breast: Secondary | ICD-10-CM

## 2016-05-08 ENCOUNTER — Emergency Department
Admission: EM | Admit: 2016-05-08 | Discharge: 2016-05-09 | Disposition: A | Discharge status: Home / Self Care | Payer: 59 | Attending: Student in an Organized Health Care Education/Training Program | Admitting: Student in an Organized Health Care Education/Training Program

## 2016-05-08 ENCOUNTER — Encounter: Payer: Self-pay | Admitting: Emergency Medicine

## 2016-05-08 DIAGNOSIS — Z79899 Other long term (current) drug therapy: Secondary | ICD-10-CM | POA: Diagnosis not present

## 2016-05-08 DIAGNOSIS — F1320 Sedative, hypnotic or anxiolytic dependence, uncomplicated: Secondary | ICD-10-CM | POA: Diagnosis not present

## 2016-05-08 DIAGNOSIS — F17210 Nicotine dependence, cigarettes, uncomplicated: Secondary | ICD-10-CM | POA: Insufficient documentation

## 2016-05-08 DIAGNOSIS — E119 Type 2 diabetes mellitus without complications: Secondary | ICD-10-CM | POA: Insufficient documentation

## 2016-05-08 DIAGNOSIS — I10 Essential (primary) hypertension: Secondary | ICD-10-CM | POA: Insufficient documentation

## 2016-05-08 MED ORDER — CHLORDIAZEPOXIDE HCL 25 MG PO CAPS: 25.0000 mg | ORAL_CAPSULE | Freq: Three times a day (TID) | ORAL | 0 refills | Status: AC

## 2016-05-08 MED ORDER — CHLORDIAZEPOXIDE HCL 25 MG PO CAPS
50.0000 mg | ORAL_CAPSULE | Freq: Once | ORAL | Status: AC
  Administered 2016-05-09: 50 mg via ORAL
  Filled 2016-05-08: qty 2

## 2016-05-08 NOTE — ED Notes (Signed)
Brother up to desk stating his sister needs detox from Xanax and Ambien; she would like detox off meds so she can get into a program for addiction; brother says pt has not voiced suicidal thoughts to family;

## 2016-05-08 NOTE — ED Triage Notes (Signed)
Pt presents to ED with family wanting to get help with her drug addiction. Pt states she got hooked on xanax several years ago and has been using frequently for the past couple of years. Denies si or hi but does report feeling a little depressed. Contracts for safety and states she would never kill herself because her mom did and she would never do that to her daughter. Last used approx 24 hours ago. Reports smoking marijuana yesterday as well.

## 2016-05-08 NOTE — ED Provider Notes (Signed)
[facility] Emergency Department Provider Note    First MD Initiated Contact with Patient 05/08/16 2336     (approximate)  I have reviewed the triage vital signs and the nursing notes.   HISTORY  Chief Complaint Drug Problem    HPI Morgan Small is a 56 y.o. female with a history of anxiety depression on Zoloft, Wellbutrin as well as 0.5 mg of Xanax prescribed twice a day for the past 4 years. Patient presents to the ER requesting help with detox due to Xanax addiction. States that she's been abusing Xanax for the past year. Typically takes 2 pills a day but quite frequently will take more she's feeling any symptoms of anxiety. Denies any alcohol abuse.  + marijuana use.States she also takes Ambien to help her sleep. Denies any agitation. No SI or HI. No hallucinations. Her last dose was 24 hours ago.   Past Medical History:  Diagnosis Date  . Diabetes mellitus without complication (HCC)   . Hypertension    No family history on file. Past Surgical History:  Procedure Laterality Date  . COLONOSCOPY WITH PROPOFOL N/A 02/03/2015   Procedure: COLONOSCOPY WITH PROPOFOL;  Surgeon: Wallace Cullens, MD;  Location: [facility] ENDOSCOPY;  Service: Gastroenterology;  Laterality: N/A;  . ESOPHAGOGASTRODUODENOSCOPY (EGD) WITH PROPOFOL N/A 02/03/2015   Procedure: ESOPHAGOGASTRODUODENOSCOPY (EGD) WITH PROPOFOL;  Surgeon: Wallace Cullens, MD;  Location: [facility] ENDOSCOPY;  Service: Gastroenterology;  Laterality: N/A;   There are no active problems to display for this patient.     Prior to Admission medications   Medication Sig Start Date End Date Taking? Authorizing Provider  cephALEXin (KEFLEX) 500 MG capsule Take 1 capsule (500 mg total) by mouth 2 (two) times daily. Patient not taking: Reported on 02/03/2015 01/14/15   Loleta Rose, MD  lisinopril (PRINIVIL,ZESTRIL) 20 MG tablet Take 20 mg by mouth daily.    Historical Provider, MD    Allergies Patient has no known  allergies.    Social History Social History  Substance Use Topics  . Smoking status: Current Every Day Smoker    Packs/day: 1.00    Types: Cigarettes  . Smokeless tobacco: Never Used  . Alcohol use Yes     Comment: occasional     Review of Systems Patient denies headaches, rhinorrhea, blurry vision, numbness, shortness of breath, chest pain, edema, cough, abdominal pain, nausea, vomiting, diarrhea, dysuria, fevers, rashes or hallucinations unless otherwise stated above in HPI. ____________________________________________   PHYSICAL EXAM:  VITAL SIGNS: Vitals:   05/08/16 1927  BP: (!) 170/78  Pulse: 94  Resp: 20  Temp: 98.4 F (36.9 C)    Constitutional: Alert and oriented. Well appearing and in no acute distress. Eyes: Conjunctivae are normal. PERRL. EOMI. Head: Atraumatic. Nose: No congestion/rhinnorhea. Mouth/Throat: Mucous membranes are moist.  Oropharynx non-erythematous. Neck: No stridor. Painless ROM. No cervical spine tenderness to palpation Hematological/Lymphatic/Immunilogical: No cervical lymphadenopathy. Cardiovascular: Normal rate, regular rhythm. Grossly normal heart sounds.  Good peripheral circulation. Respiratory: Normal respiratory effort.  No retractions. Lungs CTAB. Gastrointestinal: Soft and nontender. No distention. No abdominal bruits. No CVA tenderness. Musculoskeletal: No lower extremity tenderness nor edema.  No joint effusions. Neurologic:  Normal speech and language. No gross focal neurologic deficits are appreciated. No gait instability. No tremor, no liver flap Skin:  Skin is warm, dry and intact. No rash noted. Psychiatric: Mood and affect are normal. Speech and behavior are normal.  ____________________________________________   LABS (all labs ordered are listed, but only abnormal results  are displayed)  No results found for this or any previous visit (from the past 24  hour(s)). ____________________________________________  EKG___________________________________  RADIOLOGY   ____________________________________________   PROCEDURES  Procedure(s) performed:  Procedures    Critical Care performed: no ____________________________________________   INITIAL IMPRESSION / ASSESSMENT AND PLAN / ED COURSE  Pertinent labs & imaging results that were available during my care of the patient were reviewed by me and considered in my medical decision making (see chart for details).  DDX: Psychosis, delirium, medication effect, noncompliance, polysubstance abuse, Si, Hi, depression   Morgan Small is a 56 y.o. who presents to the ED with request for assistance with benzodiazepine detox. Patient without any evidence of active withdrawal.  She denies any SI or HI. We'll give dose of Librium now patient's last dose was yesterday. Have counseled to TTS for assistance with providing resources to the patient. m is remains him a dynamic stable and in no acute distress. We'll provide Librium taper as well as discussed signs and symptoms for which the patient should return the emergency Department.  Have discussed with the patient and available family all diagnostics and treatments performed thus far and all questions were answered to the best of my ability. The patient demonstrates understanding and agreement with plan.       ____________________________________________   FINAL CLINICAL IMPRESSION(S) / ED DIAGNOSES  Final diagnoses:  Benzodiazepine dependence (HCC)      NEW MEDICATIONS STARTED DURING THIS VISIT:  New Prescriptions   No medications on file     Note:  This document was prepared using Dragon voice recognition software and may include unintentional dictation errors.    Willy EddyPatrick Adaleigh Warf, MD 05/08/16 [phone]

## 2016-05-09 NOTE — ED Notes (Signed)
Pt requesting help with getting off xanax. Pt denies SI. Pt reports she has a place in [place] she can go if she is clean.

## 2016-06-02 ENCOUNTER — Encounter: Payer: Self-pay | Admitting: Primary Care

## 2016-06-02 ENCOUNTER — Ambulatory Visit (INDEPENDENT_AMBULATORY_CARE_PROVIDER_SITE_OTHER): Payer: 59 | Admitting: Primary Care

## 2016-06-02 ENCOUNTER — Other Ambulatory Visit: Payer: Self-pay

## 2016-06-02 VITALS — BP 130/86 | HR 81 | Temp 98.1°F | Ht 65.25 in | Wt 186.1 lb

## 2016-06-02 DIAGNOSIS — E119 Type 2 diabetes mellitus without complications: Secondary | ICD-10-CM

## 2016-06-02 DIAGNOSIS — I10 Essential (primary) hypertension: Secondary | ICD-10-CM | POA: Diagnosis not present

## 2016-06-02 DIAGNOSIS — E785 Hyperlipidemia, unspecified: Secondary | ICD-10-CM

## 2016-06-02 DIAGNOSIS — F418 Other specified anxiety disorders: Secondary | ICD-10-CM | POA: Diagnosis not present

## 2016-06-02 DIAGNOSIS — F1310 Sedative, hypnotic or anxiolytic abuse, uncomplicated: Secondary | ICD-10-CM

## 2016-06-02 DIAGNOSIS — F329 Major depressive disorder, single episode, unspecified: Secondary | ICD-10-CM | POA: Insufficient documentation

## 2016-06-02 DIAGNOSIS — F419 Anxiety disorder, unspecified: Secondary | ICD-10-CM

## 2016-06-02 DIAGNOSIS — E1165 Type 2 diabetes mellitus with hyperglycemia: Secondary | ICD-10-CM | POA: Insufficient documentation

## 2016-06-02 DIAGNOSIS — F32A Depression, unspecified: Secondary | ICD-10-CM | POA: Insufficient documentation

## 2016-06-02 LAB — LIPID PANEL
Cholesterol: 157 mg/dL (ref 0–200)
HDL: 30.7 mg/dL — AB (ref >39.00)
LDL Cholesterol: 97 mg/dL (ref 0–99)
NonHDL: 126.58
TRIGLYCERIDES: 146 mg/dL (ref 0.0–149.0)
Total CHOL/HDL Ratio: 5
VLDL: 29.2 mg/dL (ref 0.0–40.0)

## 2016-06-02 LAB — HEMOGLOBIN A1C: HEMOGLOBIN A1C: 6.9 % — AB (ref 4.6–6.5)

## 2016-06-02 MED ORDER — LISINOPRIL-HYDROCHLOROTHIAZIDE 20-12.5 MG PO TABS: 1.0000 | ORAL_TABLET | Freq: Every day | ORAL | 1 refills | Status: DC

## 2016-06-02 NOTE — Assessment & Plan Note (Signed)
Previously managed on Zoloft, Wellbutrin, Xanax, Ambien. Off all meds for the past 4 weeks. Overall managing symptoms on her own, no SI/HI. Referral placed to psychiatry per her request.  Stable for outpatient treatment.

## 2016-06-02 NOTE — Assessment & Plan Note (Signed)
Managed on glimepiride and metformin, has not taken metformin due to mix up. Repeat A1C today.
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
## 2016-06-02 NOTE — Telephone Encounter (Signed)
Pt was seen earlier today and pt wanted to know if was going to call in refills or wait on lab results. Pt is out of BP med.Is it OK to refill lisinopril-HCTZ.

## 2016-06-02 NOTE — Progress Notes (Signed)
Subjective:    Patient ID: Sander RadonBeverly S Helmer, female    DOB: 1960/08/24, 56 y.o.   MRN: [id number]  HPI  Ms. Jason FilaBray is a 56 year old female who presents today to establish care and discuss the problems mentioned below. Will obtain old records.  1) Essential Hypertension: Currently managed on lisinopril-HCTZ 20/12.5 mg. Her BP in the clinic today is 130/86. She denies visual changes, chest pain, shortness of breath.   2) Hyperlipidemia: Currently managed on atorvastatin 20 mg for which she's taken for years. Her last cholesterol check was above goal in May 2017. She is fasting today.  3) Type 2 Diabetes: Currently managed on glimepiride 4 mg (1.5 tablets), metformin 1000 mg once daily. Her last A1C was 6.7 in May 2017. She's not been taking her Metformin as she got it confused with her Carafate tablet. She denies numbness/tingling.  4) GERD/Ulcer: Currently managed on omeprazole 40 mg and Carafate 1 gm. She doesn't take these medications on a regular basis, only as needed for symptoms of epigastric pressure, esophageal burning. Mostly symptoms are infrequent.  5) Emergency Department Follow Up:   [facility] on 05/08/16 with a chief complaint of Benzodiazepine addiction. She was managed on Zoloft, Wellbutrin, and 0.5 mg of Xanax (BID) for the past four years. She was requesting detox from Xanax as she was abusing Xanax for the past 1 year as her father was chronically ill.  Also used Ambien for sleep and was abusing that as well.   During her stay in the ED she had no evidence of withdrawal. She was provided with a prescription for Librium taper and was provided with resources for outpatient treatment.   Since her visit she's been off of Xanax and Ambien since 05/08/16. She does continue to experience symptoms of palpitations, anxiety, irritability. She did complete the course of Librium as prescribed. She saw her therapist for the first time in a long time yesterday and feels it went very  well. She denies SI/HI. She would like to see a psychiatrist for further discussion and treatment. She is also wanting a urine drug screen to show her family that she's not taken Xanax or Ambien.   Review of Systems  Constitutional: Negative for fatigue.  HENT: Negative for trouble swallowing.   Eyes: Negative for visual disturbance.  Respiratory: Negative for shortness of breath.   Cardiovascular: Negative for chest pain.  Gastrointestinal: Negative for nausea.  Musculoskeletal: Negative for arthralgias.  Neurological: Negative for numbness and headaches.  Hematological: Negative for adenopathy.  Psychiatric/Behavioral: Positive for agitation and sleep disturbance. Negative for suicidal ideas. The patient is nervous/anxious.        Past Medical History:  Diagnosis Date  . Depression   . Diabetes mellitus without complication (HCC)   . Diverticulitis   . Esophageal ulcer   . GAD (generalized anxiety disorder)   . GERD (gastroesophageal reflux disease)   . Hypertension      Social History   Social History  . Marital status: Married    Spouse name: N/A  . Number of children: N/A  . Years of education: N/A   Occupational History  . Not on file.   Social History Main Topics  . Smoking status: Current Every Day Smoker    Packs/day: 1.00    Types: Cigarettes  . Smokeless tobacco: Never Used  . Alcohol use Yes     Comment: occasional   . Drug use: Yes  . Sexual activity: Not on file  Other Topics Concern  . Not on file   Social History Narrative  . No narrative on file    Past Surgical History:  Procedure Laterality Date  . COLONOSCOPY WITH PROPOFOL N/A 02/03/2015   Procedure: COLONOSCOPY WITH PROPOFOL;  Surgeon: Wallace Cullens, MD;  Location: [facility] ENDOSCOPY;  Service: Gastroenterology;  Laterality: N/A;  . ESOPHAGOGASTRODUODENOSCOPY (EGD) WITH PROPOFOL N/A 02/03/2015   Procedure: ESOPHAGOGASTRODUODENOSCOPY (EGD) WITH PROPOFOL;  Surgeon: Wallace Cullens, MD;  Location: [facility]  ENDOSCOPY;  Service: Gastroenterology;  Laterality: N/A;    No family history on file.  No Known Allergies  Current Outpatient Prescriptions on File Prior to Visit  Medication Sig Dispense Refill  . chlordiazePOXIDE (LIBRIUM) 25 MG capsule Take 1 capsule (25 mg total) by mouth 3 (three) times daily. 50 mg 3 times a day x 3 days 50 mg 2 times a day for 3 days 50 mg once a day for 3 days stop  Total of 36 pills 36 capsule 0   No current facility-administered medications on file prior to visit.     BP 130/86   Pulse 81   Temp 98.1 F (36.7 C) (Oral)   Ht 5' 5.25" (1.657 m)   Wt 186 lb 1.9 oz (84.4 kg)   SpO2 98%   BMI 30.74 kg/m    Objective:   Physical Exam  Constitutional: She is oriented to person, place, and time. She appears well-nourished.  Neck: Neck supple.  Cardiovascular: Normal rate and regular rhythm.   Pulmonary/Chest: Effort normal and breath sounds normal.  Abdominal: Soft.  Neurological: She is alert and oriented to person, place, and time.  Skin: Skin is warm and dry.  Psychiatric: She has a normal mood and affect.          Assessment & Plan:  All ED notes reviewed.

## 2016-06-02 NOTE — Patient Instructions (Addendum)
You will be contacted regarding your referral to psychiatry.  Please let us know if you have not heard back within one week.   Continue meeting with your therapist.  Complete lab work prior to leaving today. I will notify you of your results once received.   Please schedule a physical with me in June 2018. You may also schedule a lab only appointment 3-4 days prior. We will discuss your lab results in detail during your physical.  It was a pleasure to meet you today! Please don't hesitate to call me with any questions. Welcome to [facility]!

## 2016-06-02 NOTE — Assessment & Plan Note (Signed)
BP stable today, continue lisinopril/HCTZ.  BMP from 08/2015 stable.

## 2016-06-02 NOTE — Telephone Encounter (Signed)
My apologies, I wasn't aware that she needed refills. Refills sent.

## 2016-06-02 NOTE — Progress Notes (Signed)
Pre visit review using our clinic review tool, if applicable. No additional management support is needed unless otherwise documented below in the visit note. 

## 2016-06-02 NOTE — Assessment & Plan Note (Signed)
Lipids in May 2017 above goal on atorvastatin 20 mg. Repeat lipids today, consider increase in atorvastatin if needed.

## 2016-06-03 MED ORDER — GLIMEPIRIDE 4 MG PO TABS: 4.0000 mg | ORAL_TABLET | Freq: Every day | ORAL | 1 refills | Status: DC

## 2016-06-03 MED ORDER — ATORVASTATIN CALCIUM 20 MG PO TABS: 20.0000 mg | ORAL_TABLET | Freq: Every day | ORAL | 1 refills | Status: DC

## 2016-06-03 NOTE — Telephone Encounter (Signed)
Spoken and notified patient of Kate's comments. Patient verbalized understanding. 

## 2016-06-04 LAB — ZOLPIDEM QN, U
ZOLPIDEM METABOLITE: NEGATIVE ng/mL (ref <5)
ZOLPIDEM: NEGATIVE ng/mL (ref <5)

## 2016-06-05 LAB — DRUG ABUSE PANEL 10-50 NO CONF, U
AMPHETAMINES (1000 ng/mL SCRN): NEGATIVE
BARBITURATES: NEGATIVE
BENZODIAZEPINES: POSITIVE — AB
COCAINE METABOLITES: NEGATIVE
MARIJUANA MET (50 ng/mL SCRN): NEGATIVE
METHADONE: NEGATIVE
METHAQUALONE: NEGATIVE
OPIATES: NEGATIVE
PHENCYCLIDINE: NEGATIVE
PROPOXYPHENE: NEGATIVE

## 2016-07-06 DIAGNOSIS — Z79899 Other long term (current) drug therapy: Secondary | ICD-10-CM | POA: Diagnosis not present

## 2016-07-15 ENCOUNTER — Ambulatory Visit: Payer: 59 | Admitting: Family

## 2016-07-22 ENCOUNTER — Ambulatory Visit: Payer: 59 | Admitting: Family

## 2016-07-26 ENCOUNTER — Other Ambulatory Visit: Payer: Self-pay

## 2016-07-26 MED ORDER — METFORMIN HCL 1000 MG PO TABS: 1000.0000 mg | ORAL_TABLET | Freq: Every day | ORAL | 1 refills | Status: DC

## 2016-07-26 NOTE — Telephone Encounter (Signed)
Pt request refill metformin [facility]. Pt established care on 06/02/16 and pt was to take metformin. Pt will ck with pharmacy; refill done per protocol.

## 2016-07-29 ENCOUNTER — Telehealth: Payer: Self-pay | Admitting: Primary Care

## 2016-07-29 NOTE — Telephone Encounter (Signed)
Patient Name: ZACARI RADICK  DOB: 1961-03-20    Initial Comment Caller states she is having lower abdominal pain and constipation.   Nurse Assessment  Nurse: Sherilyn Cooter, RN, Thurmond Butts Date/Time Lamount Cohen Time): 07/29/2016 1:26:03 PM  Confirm and document reason for call. If symptomatic, describe symptoms. ---Caller states that her last BM was Monday. She has lower abdominal pain for the past 3 weeks. This past week has been worse. She rates her current pain as 7 on 0-10 scale. The pain is constant for more than the past 2 hours. Denies vomiting, diarrhea, and fever. She just doesn't feel good.  Does the patient have any new or worsening symptoms? ---Yes  Will a triage be completed? ---Yes  Related visit to physician within the last 2 weeks? ---No  Does the PT have any chronic conditions? (i.e. diabetes, asthma, etc.) ---Yes  List chronic conditions. ---Diabetes, HTN  Is this a behavioral health or substance abuse call? ---No     Guidelines    Guideline Title Affirmed Question Affirmed Notes  Abdominal Pain - Female [1] MILD-MODERATE pain AND [2] constant AND [3] present > 2 hours    Final Disposition User   See Physician within 4 Hours (or PCP triage) Sherilyn Cooter, RN, Thurmond Butts    Comments  Caller does not wish to be seen today. She has had this for 3 weeks. She would like to be seen tomorrow by Vernona Rieger if possible. No appointment is available for Vernona Rieger tomorrow. I was able to schedule her to be seen by Nicki Reaper NP tomorrow at 3:00pm.   Referrals  REFERRED TO PCP OFFICE   Disagree/Comply: Disagree  Disagree/Comply Reason: Disagree with instructions

## 2016-07-29 NOTE — Telephone Encounter (Signed)
Pt has appt with Pamala Hurry NP on 07/30/16 at 3pm.

## 2016-07-30 ENCOUNTER — Ambulatory Visit (INDEPENDENT_AMBULATORY_CARE_PROVIDER_SITE_OTHER): Payer: 59 | Admitting: Internal Medicine

## 2016-07-30 ENCOUNTER — Encounter: Payer: Self-pay | Admitting: Internal Medicine

## 2016-07-30 VITALS — BP 126/70 | HR 97 | Temp 98.5°F | Wt 196.8 lb

## 2016-07-30 DIAGNOSIS — R1032 Left lower quadrant pain: Secondary | ICD-10-CM

## 2016-07-30 DIAGNOSIS — R140 Abdominal distension (gaseous): Secondary | ICD-10-CM

## 2016-07-30 DIAGNOSIS — K5900 Constipation, unspecified: Secondary | ICD-10-CM

## 2016-07-30 DIAGNOSIS — K5792 Diverticulitis of intestine, part unspecified, without perforation or abscess without bleeding: Secondary | ICD-10-CM | POA: Diagnosis not present

## 2016-07-30 MED ORDER — METRONIDAZOLE 500 MG PO TABS: 500.0000 mg | ORAL_TABLET | Freq: Three times a day (TID) | ORAL | 0 refills | Status: DC

## 2016-07-30 MED ORDER — CIPROFLOXACIN HCL 500 MG PO TABS: 500.0000 mg | ORAL_TABLET | Freq: Two times a day (BID) | ORAL | 0 refills | Status: DC

## 2016-07-30 NOTE — Patient Instructions (Signed)

## 2016-07-30 NOTE — Progress Notes (Signed)
Subjective:    Patient ID: Morgan Small, female    DOB: Jul 31, 1960, 56 y.o.   MRN: [id number]  HPI  Pt presents to the clinic today with c/o abdominal pain. She reports this started 3 weeks ago. She describes the pain as sharp and stabbing. It is associated with bloating and constipation. She denies, vomiting and diarrhea. She has only had small bowel movements over the last 2 days. She denies blood in her stool. She denies urinary or vaginal complaints. She has tried Dulcolax without any relief. She does have a history of GERD and is taking Prilosec daily as prescribed. She has a colonoscopy in 2016, which did show some diverticulosis.   Review of Systems      Past Medical History:  Diagnosis Date  . Arthritis   . Depression   . Diabetes mellitus without complication (HCC)   . Diverticulitis   . Esophageal ulcer   . GAD (generalized anxiety disorder)   . GERD (gastroesophageal reflux disease)   . Hyperlipidemia   . Hypertension     Current Outpatient Prescriptions  Medication Sig Dispense Refill  . atorvastatin (LIPITOR) 20 MG tablet Take 1 tablet (20 mg total) by mouth daily at 6 PM. 90 tablet 1  . glimepiride (AMARYL) 4 MG tablet Take 1 tablet (4 mg total) by mouth daily with breakfast. 90 tablet 1  . lisinopril-hydrochlorothiazide (PRINZIDE,ZESTORETIC) 20-12.5 MG tablet Take 1 tablet by mouth daily. 90 tablet 1  . metFORMIN (GLUCOPHAGE) 1000 MG tablet Take 1 tablet (1,000 mg total) by mouth daily with breakfast. 90 tablet 1  . omeprazole (PRILOSEC) 40 MG capsule Take 40 mg by mouth.     . sucralfate (CARAFATE) 1 g tablet TAKE ONE TABLET BY MOUTH 4 TIMES DAILY BEFORE MEAL(S) AND NIGHTLY     No current facility-administered medications for this visit.     No Known Allergies  Family History  Problem Relation Age of Onset  . Arthritis Mother   . Hyperlipidemia Mother   . Hypertension Mother   . Mental illness Mother   . Diabetes Mother   . Arthritis Father   .  Hypertension Father   . Mental illness Father   . Alcohol abuse Maternal Grandmother   . Learning disabilities Maternal Grandmother   . Learning disabilities Maternal Grandfather     Social History   Social History  . Marital status: Married    Spouse name: N/A  . Number of children: N/A  . Years of education: N/A   Occupational History  . Not on file.   Social History Main Topics  . Smoking status: Current Every Day Smoker    Packs/day: 1.00    Types: Cigarettes  . Smokeless tobacco: Never Used  . Alcohol use Yes     Comment: occasional   . Drug use: Yes  . Sexual activity: Not on file   Other Topics Concern  . Not on file   Social History Narrative  . No narrative on file     Constitutional: Denies fever, malaise, fatigue, headache or abrupt weight changes.  Respiratory: Denies difficulty breathing, shortness of breath, cough or sputum production.   Cardiovascular: Denies chest pain, chest tightness, palpitations or swelling in the hands or feet.  Gastrointestinal: Pt reports abdominal pain, bloating and constipation. Denies diarrhea or blood in the stool.  GU: Denies urgency, frequency, pain with urination, burning sensation, blood in urine, odor or discharge.  No other specific complaints in a complete review of systems (except as  listed in HPI above).  Objective:   Physical Exam   BP 126/70   Pulse 97   Temp 98.5 F (36.9 C) (Oral)   Wt 196 lb 12 oz (89.2 kg)   SpO2 98%   BMI 32.49 kg/m  Wt Readings from Last 3 Encounters:  07/30/16 196 lb 12 oz (89.2 kg)  06/02/16 186 lb 1.9 oz (84.4 kg)  05/08/16 180 lb (81.6 kg)    General: Appears her stated age, obese in NAD. Abdomen: Soft and tender in the LLQ. Hypoactive bowel sounds. No distention or masses noted.    BMET    Component Value Date/Time   NA 136 01/14/2015 1604   NA 135 (L) 02/24/2012 1047   K 3.8 01/14/2015 1604   K 4.1 02/24/2012 1047   CL 105 01/14/2015 1604   CL 103 02/24/2012  1047   CO2 24 01/14/2015 1604   CO2 25 02/24/2012 1047   GLUCOSE 137 (H) 01/14/2015 1604   GLUCOSE 246 (H) 02/24/2012 1047   BUN 15 01/14/2015 1604   BUN 12 02/24/2012 1047   CREATININE 1.00 06/23/2015 1847   CREATININE 0.96 02/24/2012 1047   CALCIUM 9.2 01/14/2015 1604   CALCIUM 9.1 02/24/2012 1047   GFRNONAA 48 (L) 01/14/2015 1604   GFRNONAA >60 02/24/2012 1047   GFRAA 56 (L) 01/14/2015 1604   GFRAA >60 02/24/2012 1047    Lipid Panel     Component Value Date/Time   CHOL 157 06/02/2016 1116   TRIG 146.0 06/02/2016 1116   HDL 30.70 (L) 06/02/2016 1116   CHOLHDL 5 06/02/2016 1116   VLDL 29.2 06/02/2016 1116   LDLCALC 97 06/02/2016 1116    CBC    Component Value Date/Time   WBC 9.6 01/14/2015 1604   RBC 4.45 01/14/2015 1604   HGB 12.7 01/14/2015 1604   HGB 10.6 (L) 03/07/2012 1153   HCT 38.1 01/14/2015 1604   HCT 29.5 (L) 03/08/2012 0618   PLT 168 01/14/2015 1604   PLT 181 02/24/2012 1047   MCV 85.6 01/14/2015 1604   MCV 95 02/24/2012 1047   MCH 28.4 01/14/2015 1604   MCHC 33.2 01/14/2015 1604   RDW 16.0 (H) 01/14/2015 1604   RDW 12.7 02/24/2012 1047   LYMPHSABS 1.7 01/14/2015 1604   MONOABS 0.8 01/14/2015 1604   EOSABS 1.0 (H) 01/14/2015 1604   BASOSABS 0.1 01/14/2015 1604    Hgb A1C Lab Results  Component Value Date   HGBA1C 6.9 (H) 06/02/2016           Assessment & Plan:   Abdominal Pain, Bloating and Constipation:  Concerning for diverticulitis Discussed clear liquid diet, and advance as tolerated Will treat with 10 days of Cipro and Flagyl  Return precautions discussed Nicki Reaper, NP

## 2016-08-03 DIAGNOSIS — Z79899 Other long term (current) drug therapy: Secondary | ICD-10-CM | POA: Diagnosis not present

## 2016-08-27 ENCOUNTER — Other Ambulatory Visit: Payer: Self-pay | Admitting: Primary Care

## 2016-08-27 DIAGNOSIS — E119 Type 2 diabetes mellitus without complications: Secondary | ICD-10-CM

## 2016-08-27 DIAGNOSIS — I10 Essential (primary) hypertension: Secondary | ICD-10-CM

## 2016-08-31 ENCOUNTER — Other Ambulatory Visit: Payer: Self-pay | Admitting: Primary Care

## 2016-09-06 ENCOUNTER — Other Ambulatory Visit (INDEPENDENT_AMBULATORY_CARE_PROVIDER_SITE_OTHER): Payer: 59

## 2016-09-06 DIAGNOSIS — I10 Essential (primary) hypertension: Secondary | ICD-10-CM | POA: Diagnosis not present

## 2016-09-06 DIAGNOSIS — E119 Type 2 diabetes mellitus without complications: Secondary | ICD-10-CM

## 2016-09-06 LAB — COMPREHENSIVE METABOLIC PANEL
ALBUMIN: 4.6 g/dL (ref 3.5–5.2)
ALK PHOS: 61 U/L (ref 39–117)
ALT: 20 U/L (ref 0–35)
AST: 20 U/L (ref 0–37)
BUN: 16 mg/dL (ref 6–23)
CHLORIDE: 101 meq/L (ref 96–112)
CO2: 29 mEq/L (ref 19–32)
Calcium: 10 mg/dL (ref 8.4–10.5)
Creatinine, Ser: 0.95 mg/dL (ref 0.40–1.20)
GFR: 64.41 mL/min (ref >60.00)
Glucose, Bld: 132 mg/dL — ABNORMAL HIGH (ref 70–99)
POTASSIUM: 4.1 meq/L (ref 3.5–5.1)
SODIUM: 138 meq/L (ref 135–145)
TOTAL PROTEIN: 7.8 g/dL (ref 6.0–8.3)
Total Bilirubin: 0.5 mg/dL (ref 0.2–1.2)

## 2016-09-06 LAB — HEMOGLOBIN A1C: Hgb A1c MFr Bld: 7.9 % — ABNORMAL HIGH (ref 4.6–6.5)

## 2016-09-09 ENCOUNTER — Encounter: Payer: 59 | Admitting: Primary Care

## 2016-09-13 ENCOUNTER — Ambulatory Visit (INDEPENDENT_AMBULATORY_CARE_PROVIDER_SITE_OTHER): Payer: 59 | Admitting: Primary Care

## 2016-09-13 ENCOUNTER — Encounter: Payer: Self-pay | Admitting: Primary Care

## 2016-09-13 VITALS — BP 128/82 | HR 91 | Temp 98.1°F | Ht 65.0 in | Wt 197.0 lb

## 2016-09-13 DIAGNOSIS — F329 Major depressive disorder, single episode, unspecified: Secondary | ICD-10-CM

## 2016-09-13 DIAGNOSIS — Z1231 Encounter for screening mammogram for malignant neoplasm of breast: Secondary | ICD-10-CM

## 2016-09-13 DIAGNOSIS — F32A Depression, unspecified: Secondary | ICD-10-CM

## 2016-09-13 DIAGNOSIS — R1032 Left lower quadrant pain: Secondary | ICD-10-CM

## 2016-09-13 DIAGNOSIS — Z23 Encounter for immunization: Secondary | ICD-10-CM

## 2016-09-13 DIAGNOSIS — F419 Anxiety disorder, unspecified: Secondary | ICD-10-CM

## 2016-09-13 DIAGNOSIS — E785 Hyperlipidemia, unspecified: Secondary | ICD-10-CM | POA: Diagnosis not present

## 2016-09-13 DIAGNOSIS — E119 Type 2 diabetes mellitus without complications: Secondary | ICD-10-CM

## 2016-09-13 DIAGNOSIS — Z0000 Encounter for general adult medical examination without abnormal findings: Secondary | ICD-10-CM

## 2016-09-13 DIAGNOSIS — I10 Essential (primary) hypertension: Secondary | ICD-10-CM

## 2016-09-13 DIAGNOSIS — Z1239 Encounter for other screening for malignant neoplasm of breast: Secondary | ICD-10-CM

## 2016-09-13 MED ORDER — GLIMEPIRIDE 2 MG PO TABS: 2.0000 mg | ORAL_TABLET | Freq: Every day | ORAL | 1 refills | Status: DC

## 2016-09-13 MED ORDER — AMOXICILLIN-POT CLAVULANATE 875-125 MG PO TABS: 1.0000 | ORAL_TABLET | Freq: Two times a day (BID) | ORAL | 0 refills | Status: DC

## 2016-09-13 NOTE — Assessment & Plan Note (Signed)
Immunizations UTD, pneumovax 23 administered today. Pap UTD, mammogram due and will schedule. Colonoscopy UTD. Discussed the importance of a healthy diet and regular exercise in order for weight loss, and to reduce the risk of other medical problems. Exam today otherwise stable, treating abdominal pain. Labs with increase in A1c, otherwise stable. Follow up in 1 year for annual exam.

## 2016-09-13 NOTE — Progress Notes (Signed)
Subjective:    Patient ID: Morgan Small, female    DOB: March 30, 1961, 56 y.o.   MRN: [id number]  HPI  Morgan Small is a 56 year old female who presents today for complete physical and a chief complaint of abdominal pain.  1) Abdominal Pain: Began three days ago that is located to the left lower quadrant with radiation to her RLQ. She had low grade fevers on Saturday night with improvement in fevers after taking Ibuprofen. Her last normal bowel movement was Thursday last week, small bowel movement yesterday. She describes her pain as constant sore with intermittent sharp pain.   She was treated for acute diverticulitis in April 2018 with relief in symptoms. Her current symptoms feel similar to that visit in April. She's been eating a bland diet over the past several days. Denies nausea, vomiting, bloody stools.  Immunizations: -Tetanus: Completed in 2015 -Influenza: Did not complete last season. -Pneumonia: Never completed, due.  Diet: She endorses a fair diet. Breakfast: Oatmeal, toast Lunch: Sandwich Dinner: Meat, vegetable, starch Snacks: Chips, fruit Desserts: None Beverages: Water, diet ginger ale, coffee  Exercise: She does not routinely exercise. Eye exam: Due this year. Dental exam: Dentures Colonoscopy: Completed in 2016, due in 2026. Pap Smear: Completed in 2016. Mammogram: Due this year.     Review of Systems  Constitutional: Negative for unexpected weight change.  HENT: Negative for rhinorrhea.   Respiratory: Negative for cough and shortness of breath.   Cardiovascular: Negative for chest pain.  Gastrointestinal: Positive for abdominal pain and constipation. Negative for blood in stool, diarrhea, nausea and vomiting.  Genitourinary: Negative for difficulty urinating and menstrual problem.  Musculoskeletal: Negative for arthralgias and myalgias.  Skin: Negative for rash.  Allergic/Immunologic: Negative for environmental allergies.  Neurological: Negative for  dizziness, numbness and headaches.  Psychiatric/Behavioral:       She denies concerns for anxiety and depression       Past Medical History:  Diagnosis Date  . Arthritis   . Depression   . Diabetes mellitus without complication (HCC)   . Diverticulitis   . Esophageal ulcer   . GAD (generalized anxiety disorder)   . GERD (gastroesophageal reflux disease)   . Hyperlipidemia   . Hypertension      Social History   Social History  . Marital status: Married    Spouse name: N/A  . Number of children: N/A  . Years of education: N/A   Occupational History  . Not on file.   Social History Main Topics  . Smoking status: Current Every Day Smoker    Packs/day: 1.00    Types: Cigarettes  . Smokeless tobacco: Never Used  . Alcohol use Yes     Comment: occasional   . Drug use: Yes  . Sexual activity: Not on file   Other Topics Concern  . Not on file   Social History Narrative  . No narrative on file    Past Surgical History:  Procedure Laterality Date  . APPENDECTOMY  1994  . COLONOSCOPY WITH PROPOFOL N/A 02/03/2015   Procedure: COLONOSCOPY WITH PROPOFOL;  Surgeon: Wallace CullensPaul Y Oh, MD;  [facility];  Service: Gastroenterology;  Laterality: N/A;  . ESOPHAGOGASTRODUODENOSCOPY (EGD) WITH PROPOFOL N/A 02/03/2015   Procedure: ESOPHAGOGASTRODUODENOSCOPY (EGD) WITH PROPOFOL;  Surgeon: Wallace CullensPaul Y Oh, MD;  [facility];  Service: Gastroenterology;  Laterality: N/A;  . GALLBLADDER SURGERY  1989    Family History  Problem Relation Age of Onset  . Arthritis Mother   .  Hyperlipidemia Mother   . Hypertension Mother   . Mental illness Mother   . Diabetes Mother   . Arthritis Father   . Hypertension Father   . Mental illness Father   . Alcohol abuse Maternal Grandmother   . Learning disabilities Maternal Grandmother   . Learning disabilities Maternal Grandfather     No Known Allergies  Current Outpatient Prescriptions on File Prior to Visit  Medication Sig  Dispense Refill  . atorvastatin (LIPITOR) 20 MG tablet Take 1 tablet (20 mg total) by mouth daily at 6 PM. 90 tablet 1  . glimepiride (AMARYL) 4 MG tablet Take 1 tablet (4 mg total) by mouth daily with breakfast. 90 tablet 1  . lisinopril-hydrochlorothiazide (PRINZIDE,ZESTORETIC) 20-12.5 MG tablet Take 1 tablet by mouth daily. 90 tablet 1  . metFORMIN (GLUCOPHAGE) 1000 MG tablet Take 1 tablet (1,000 mg total) by mouth daily with breakfast. 90 tablet 1  . mirtazapine (REMERON) 30 MG tablet TAKE 1 TABLET BY MOUTH AT BEDTIME 30 tablet 0  . sucralfate (CARAFATE) 1 g tablet TAKE ONE TABLET BY MOUTH 4 TIMES DAILY BEFORE MEAL(S) AND NIGHTLY    . vitamin B-12 (CYANOCOBALAMIN) 1000 MCG tablet Take 1,000 mcg by mouth daily.     No current facility-administered medications on file prior to visit.     BP 128/82   Pulse 91   Temp 98.1 F (36.7 C) (Oral)   Ht 5\' 5"  (1.651 m)   Wt 197 lb (89.4 kg)   SpO2 98%   BMI 32.78 kg/m    Objective:   Physical Exam  Constitutional: She is oriented to person, place, and time. She appears well-nourished. She does not appear ill.  HENT:  Right Ear: Tympanic membrane and ear canal normal.  Left Ear: Tympanic membrane and ear canal normal.  Nose: Nose normal.  Mouth/Throat: Oropharynx is clear and moist.  Eyes: Conjunctivae and EOM are normal. Pupils are equal, round, and reactive to light.  Neck: Neck supple. No thyromegaly present.  Cardiovascular: Normal rate and regular rhythm.   No murmur heard. Pulmonary/Chest: Effort normal and breath sounds normal. She has no rales.  Abdominal: Soft. Bowel sounds are normal. There is tenderness in the right lower quadrant and left lower quadrant.  Musculoskeletal: Normal range of motion.  Lymphadenopathy:    She has no cervical adenopathy.  Neurological: She is alert and oriented to person, place, and time. She has normal reflexes. No cranial nerve deficit.  Skin: Skin is warm and dry. No rash noted.  Psychiatric:  She has a normal mood and affect.          Assessment & Plan:

## 2016-09-13 NOTE — Assessment & Plan Note (Signed)
History of diverticulosis with likely acute diverticulitis now. Also with symptoms in April 2018 that resolved after antibiotics. Exam today suspicious for infection, especially given fevers. Rx for Augmentin course sent to pharmacy. She will update if no improvement in 3-4 days. She appears well and is stable for outpatient treatment.

## 2016-09-13 NOTE — Addendum Note (Signed)
Addended by: Tawnya CrookSAMBATH, Marasia Newhall on: 09/13/2016 05:08 PM   Modules accepted: Orders

## 2016-09-13 NOTE — Assessment & Plan Note (Signed)
Increase in A1C from 6.9 to 7.9 in three months. Will increase Glimepiride to 6 mg (4 mg plus 2 mg), continue Metformin. She cannot tolerate increases in Metformin due to diarrhea. Pneumovax provided today. Managed on ACE and statin. Repeat A1C in 3 months, foot exam next visit.

## 2016-09-13 NOTE — Patient Instructions (Signed)
We've increased your dose of Glimepiride from 4 mg to 6 mg. Take the 4 mg and 2 mg tablet every morning with breakfast. Continue taking Metformin once daily.  Start exercising. You should be getting 150 minutes of moderate intensity exercise weekly.  Ensure you are consuming 64 ounces of water daily.  You were provided with a pneumonia vaccination today. We will repeat this in 5 years.  Start Augmentin antibiotics for likely diverticulitis. Take 1 tablet by mouth twice daily for 10 days. Start a clear liquid diet. Please notify me if no improvement by Friday this week.  Schedule a lab only appointment in three months to recheck your A1C.  Follow up in 6 months for re-evaluation.  It was a pleasure to see you today!    Clear Liquid Diet, Adult A clear liquid diet is a diet that includes only liquids that you can see through. You may need to follow a clear liquid diet if:  You develop a medical condition right before or after you have surgery.  You were not able to eat food for a long period of time.  You had a condition that gave you diarrhea.  You are going to have an exam, such as a colonoscopy, in which instruments will be put into your body to look at parts of your digestive system.  You are going to have bowel surgery.  The usual goals of this diet are:  To rest the stomach and digestive system as much as possible.  To keep you hydrated.  To make sure you get some calories for energy.  To help you return to normal digestion.  Most people need to follow this diet for only a short period of time. What do I need to know about this diet?  A clear liquid is a liquid that you can see through when you hold it up to a light.  A clear liquid diet does not provide all the nutrients that you need. It is important to choose a variety of the liquids that are allowed on this diet. That way, you will get as many nutrients as possible.  If you are not sure whether you can have  certain items, ask your health care provider. What can I have?  Water and flavored water.  Fruit juices that do not have pulp, such as cranberry juice and apple juice.  Tea and coffee without milk or cream.  Clear bouillon or broth.  Broth-based soups that have been strained.  Flavored gelatins.  Honey.  Sugar water.  Frozen ice or frozen ice pops that do not contain milk, yogurt, fruit pieces, or fruit pulp.  Clear sodas.  Clear sports drinks. The items listed above may not be a complete list of recommended liquids. Contact your dietitian for more options. What can I not have?  Juices that have pulp.  Milk.  Cream or cream-based soups.  Yogurt. The items listed above may not be a complete list of liquids to avoid. Contact your dietitian for more information. Summary  A clear liquid diet is a diet that includes only liquids that you can see through.  The goal of this diet is to help you recover by resting your digestive system, keeping you hydrated, and providing nutrients.  Make sure to avoid liquids with milk, cream, or pulp while on this diet. This information is not intended to replace advice given to you by your health care provider. Make sure you discuss any questions you have with your health care  provider. Document Released: 03/22/2005 Document Revised: 11/04/2015 Document Reviewed: 02/16/2013 Elsevier Interactive Patient Education  [employer]2018 [employer]

## 2016-09-13 NOTE — Assessment & Plan Note (Signed)
Stable in the office today, continue lisinopril-hctz. BMP unremarkable.

## 2016-09-13 NOTE — Assessment & Plan Note (Signed)
Lipids in February unremarkable. Continue atorvastatin.

## 2016-09-13 NOTE — Assessment & Plan Note (Signed)
Currently following with psychiatry.

## 2016-11-15 IMAGING — CT CT ABD-PELV W/ CM
2 of 5 series · 15 of 46 positions shown, 17 images · IV contrast (omnipaque)
Comparison: 01/14/2012, 12/08/2010.

CLINICAL DATA: Three week history of epigastric abdominal pain,
abdominal bloating, nausea, and bloody stools. Patient describes a
fall approximately 2 weeks ago when she struck the left side of her
body.

EXAM:
CT ABDOMEN AND PELVIS WITH CONTRAST
TECHNIQUE: Multidetector CT imaging of the abdomen and pelvis was performed
using the standard protocol following bolus administration of
intravenous contrast.
CONTRAST:  100mL OMNIPAQUE IOHEXOL 300 MG/ML IV. Oral contrast was
also administered.

[Series 2: routine with · axial · 0.74mm/px · z∈[-1109,-689]mm · 12 of 94 slices shown, 14 images]
[im 5/94  soft-tissue]
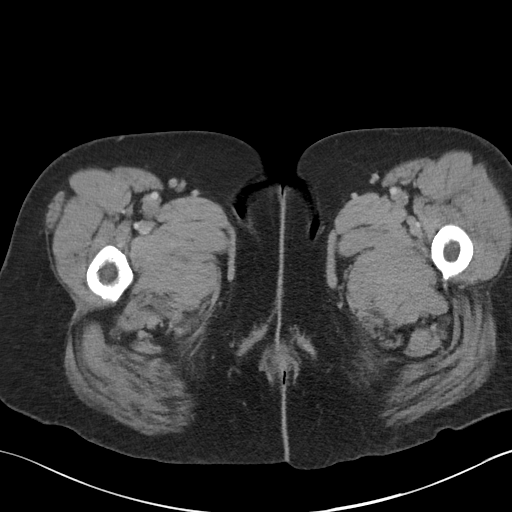
[im 5/94  bone]
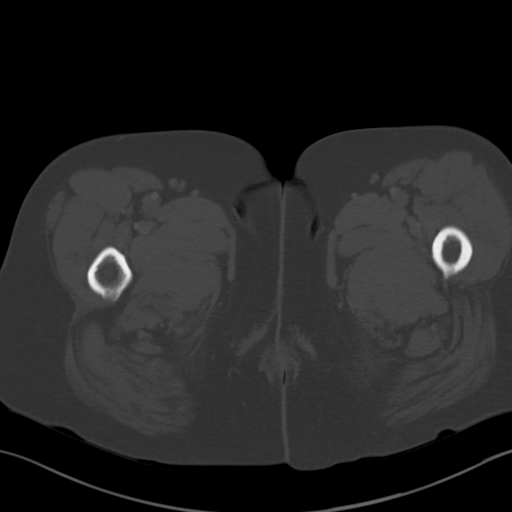
[im 15/94  soft-tissue]
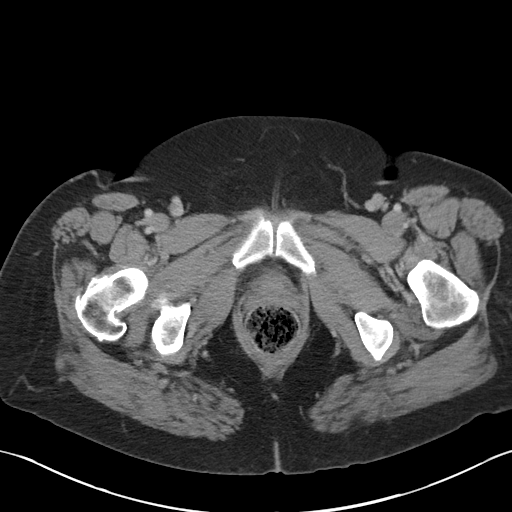
[im 20/94  soft-tissue]
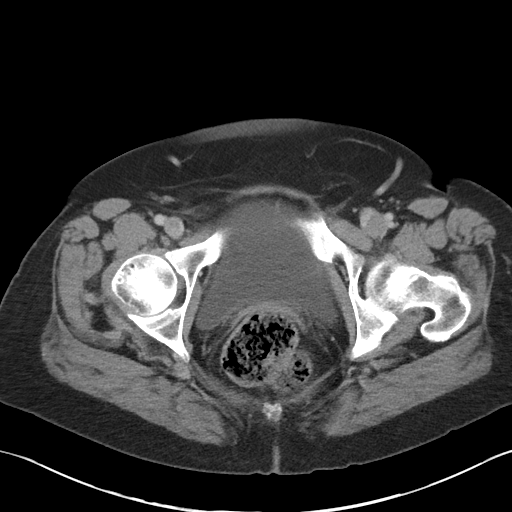
[im 30/94  soft-tissue]
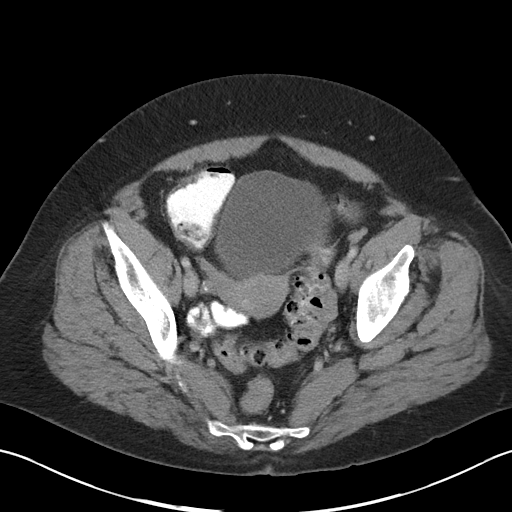
[im 35/94  soft-tissue]
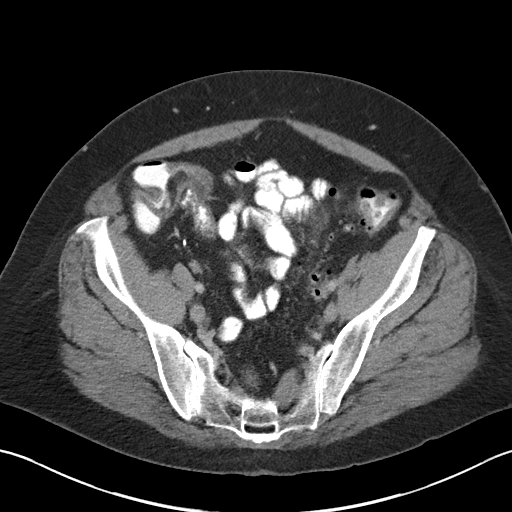
[im 45/94  soft-tissue]
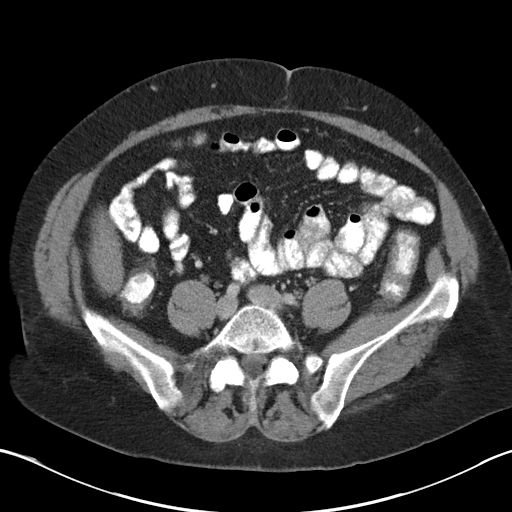
[im 49/94  soft-tissue]
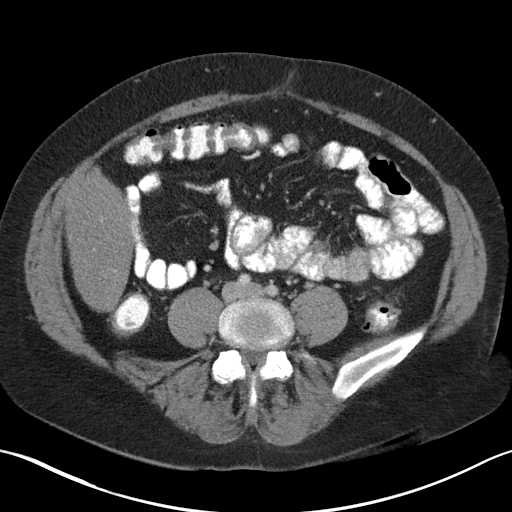
[im 59/94  soft-tissue]
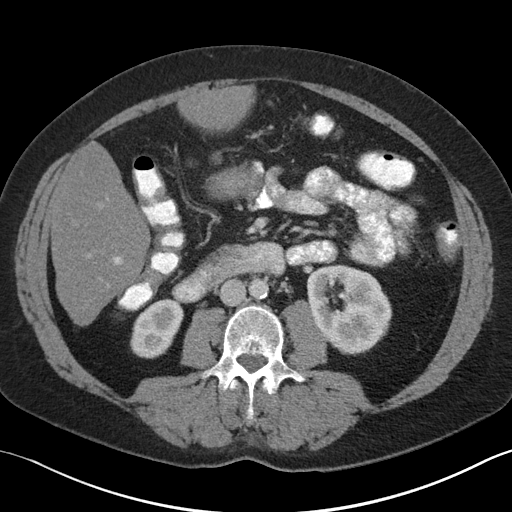
[im 64/94  soft-tissue]
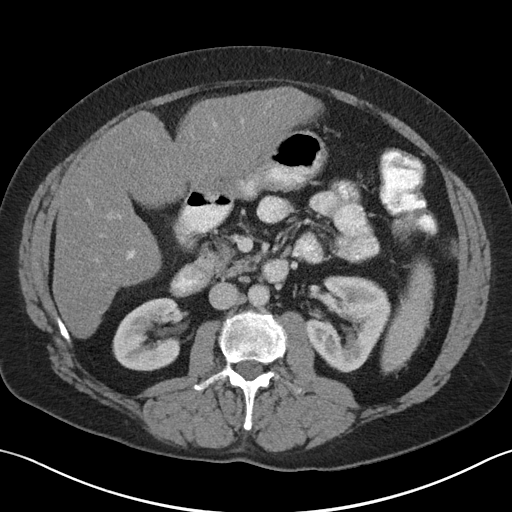
[im 64/94  bone]
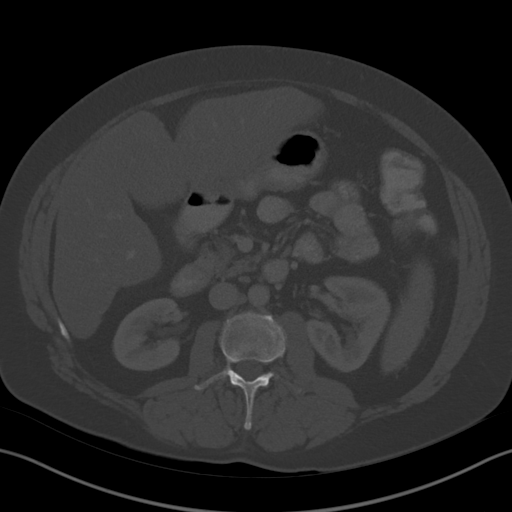
[im 74/94  soft-tissue]
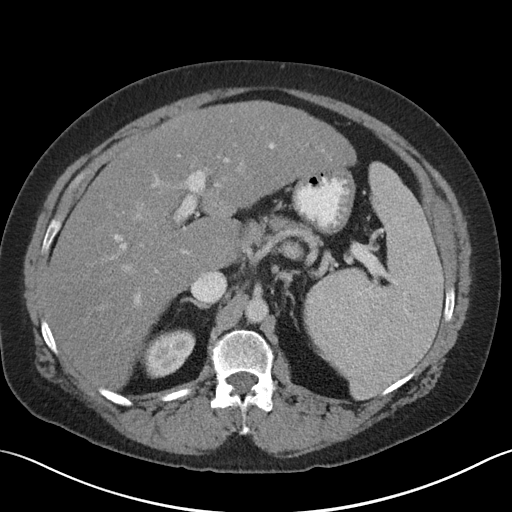
[im 79/94  soft-tissue]
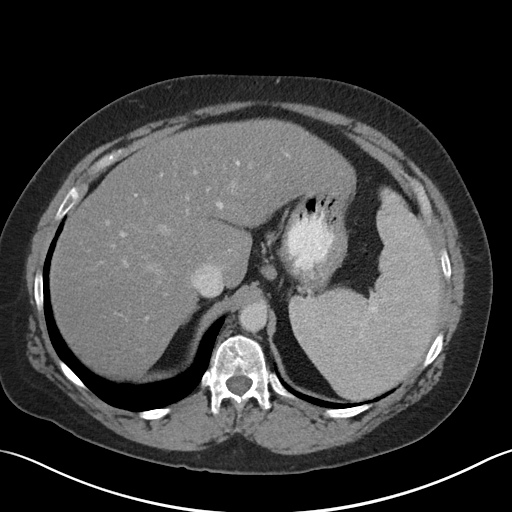
[im 89/94  soft-tissue]
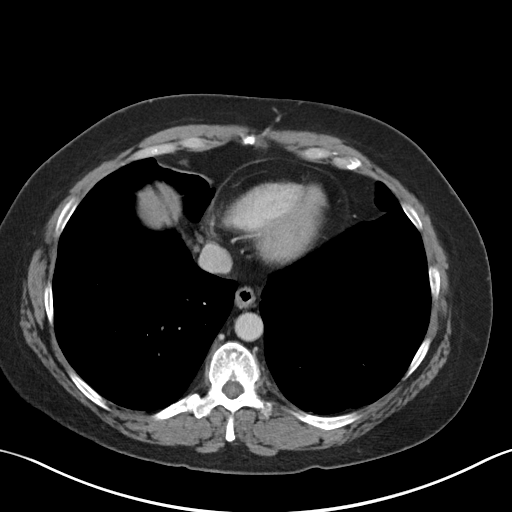

[Series 5: cor routine with · coronal · 0.75mm/px · 3 of 156 slices shown]
[im 52/156  soft-tissue]
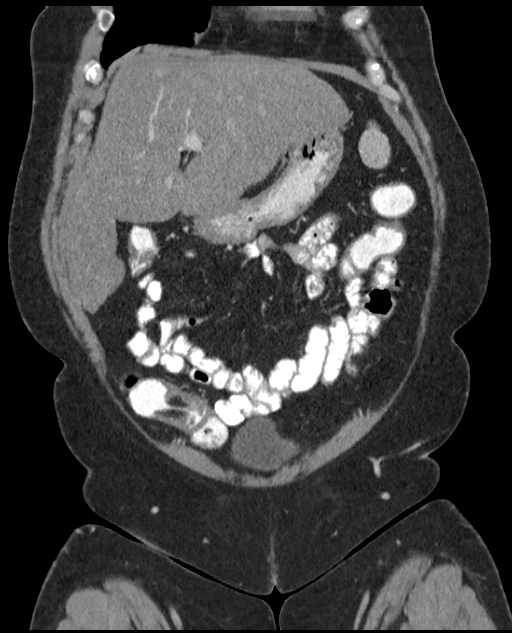
[im 69/156  soft-tissue]
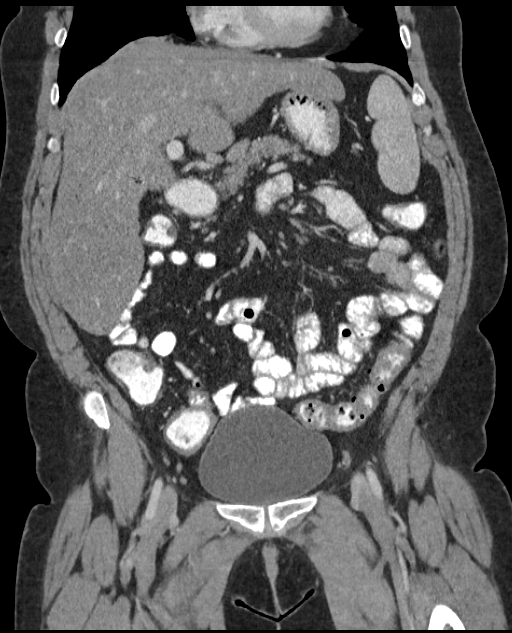
[im 87/156  soft-tissue]
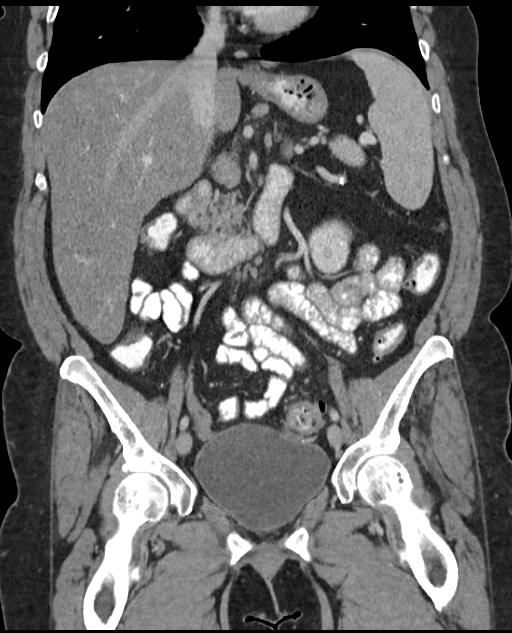

[15 of 46 positions shown; findings below may reference images not displayed]

FINDINGS: Hepatobiliary: Moderate to marked hepatomegaly. Relative enlargement
of the left lobe and caudate lobe. Diffuse hepatic steatosis without
focal hepatic parenchymal abnormality. Patent portal vein.
Gallbladder surgically absent. No unexpected biliary ductal
dilation.

Spleen: Enlarged, measuring approximately 12.5 x 10.4 x 12.3 cm,
yielding a volume of approximately 800 ml. No focal splenic
parenchymal abnormality.

Pancreas:  Moderate atrophy without focal parenchymal abnormality.

Adrenal glands:  Normal in appearance.

Genitourinary: Both kidneys normal in size and appearance. No
evidence of urinary tract calculi. Normal-appearing urinary bladder.

Normal-appearing uterus and ovaries without evidence of adnexal mass
or free pelvic fluid.

Gastrointestinal: Stomach normal in appearance for degree of
distention. Mild thickening of the wall of the terminal ileum over
several cm segment. Small bowel otherwise normal in appearance.
Lipoma involving the ileocecal valve. Mild wall thickening involving
the entire colon with the exception of the rectum. Large rectal
stool burden. Liquid stool present elsewhere in the colon.
Descending and sigmoid colon diverticulosis without evidence of
acute diverticulitis. Appendix presumed surgically absent.

Ascites:  Absent.

Vascular: Moderate aortoiliac atherosclerosis without evidence of
aneurysm. Patent visceral arteries.

Lymphatic: Mildly enlarged gastrohepatic ligament lymph nodes
measuring in total approximately 2.1 x 4.0 x 1.3 cm. Mildly enlarged
portacaval lymph nodes measuring in total approximately 1.7 x 2.5 x
3.2 cm. Scattered normal size lymph nodes elsewhere in the
retroperitoneum and mesentery.

Other findings: Decrease in size of the previously identified
sebaceous cyst involving the skin of the anterior inferior pelvis to
the right of midline.

Musculoskeletal: Mild degenerative disc disease and spondylosis
involving the lower thoracic spine and at L3-4.

Visualized lower thorax: Heart size normal. Prominent epicardial fat
as noted previously. Visualized lung bases clear.
IMPRESSION: 1. Wall thickening involving a several cm segment of the terminal
ileum and wall thickening involving the colon from the cecum to the
distal sigmoid colon with sparing of the rectum. Differential
diagnosis includes infectious enterocolitis and inflammatory bowel
disease (ulcerative colitis with backwash ileitis).
2. Hepatosplenomegaly. Diffuse hepatic steatosis. Query early
hepatic cirrhosis.
3. Descending and sigmoid colon diverticulosis without evidence of
acute diverticulitis.
4. Likely reactive mildly enlarged gastrohepatic ligament and
portacaval lymph nodes. No pathologic lymphadenopathy elsewhere.

## 2016-12-04 LAB — HM DIABETES EYE EXAM

## 2016-12-06 ENCOUNTER — Other Ambulatory Visit: Payer: Self-pay | Admitting: Primary Care

## 2016-12-06 DIAGNOSIS — I10 Essential (primary) hypertension: Secondary | ICD-10-CM

## 2016-12-14 ENCOUNTER — Other Ambulatory Visit: Payer: 59

## 2016-12-17 ENCOUNTER — Other Ambulatory Visit: Payer: 59

## 2016-12-24 ENCOUNTER — Encounter: Payer: Self-pay | Admitting: Primary Care

## 2016-12-24 ENCOUNTER — Other Ambulatory Visit (INDEPENDENT_AMBULATORY_CARE_PROVIDER_SITE_OTHER): Payer: 59

## 2016-12-24 DIAGNOSIS — E119 Type 2 diabetes mellitus without complications: Secondary | ICD-10-CM

## 2016-12-24 LAB — HEMOGLOBIN A1C: Hgb A1c MFr Bld: 8.1 % — ABNORMAL HIGH (ref 4.6–6.5)

## 2016-12-25 NOTE — Telephone Encounter (Signed)
Needs office visit for follow up in 3 months, please schedule.

## 2016-12-27 NOTE — Telephone Encounter (Signed)
Per DPR, left detail message of Morgan Small's comments for patient to call back to schedule the follow up appt.  Also sent patient a message through MyChart

## 2016-12-31 ENCOUNTER — Telehealth: Payer: Self-pay

## 2016-12-31 DIAGNOSIS — E119 Type 2 diabetes mellitus without complications: Secondary | ICD-10-CM

## 2016-12-31 MED ORDER — GLIPIZIDE 10 MG PO TABS: 10.0000 mg | ORAL_TABLET | Freq: Two times a day (BID) | ORAL | 3 refills | Status: DC

## 2016-12-31 MED ORDER — METFORMIN HCL 1000 MG PO TABS: 1000.0000 mg | ORAL_TABLET | Freq: Two times a day (BID) | ORAL | 3 refills | Status: DC

## 2016-12-31 NOTE — Telephone Encounter (Signed)
Spoken and notified patient of Kate's comments. Patient verbalized understanding. 

## 2016-12-31 NOTE — Telephone Encounter (Signed)
Tell her thanks for the update on Metformin, continue 1000 mg twice daily. Okay to change glimepiride to glipizide. Rx for glipizide 10 mg tablets sent to pharmacy. Take 1 tablet by mouth twice daily. Also refilled Metformin with new directions.

## 2016-12-31 NOTE — Telephone Encounter (Signed)
Spoken to patient. She wondering if it is possible to change glimepiride to glipizide 10 mg. She stated that the glimepiride really have not done much help for her and would like to know if okay to change.  Also patient is taking 2000 mg of the metformin a she was told to. She is had some stomach issue but this has been improving.  Patient would like a new prescription to be sent to the pharmacy since she had been taking 2000 mg, she is running low.

## 2016-12-31 NOTE — Telephone Encounter (Signed)
Patient left vm needs to discuss diabetes meds.

## 2017-01-17 ENCOUNTER — Other Ambulatory Visit: Payer: Self-pay | Admitting: Primary Care

## 2017-03-07 ENCOUNTER — Other Ambulatory Visit: Payer: Self-pay | Admitting: Primary Care

## 2017-03-07 DIAGNOSIS — E119 Type 2 diabetes mellitus without complications: Secondary | ICD-10-CM

## 2017-03-15 ENCOUNTER — Ambulatory Visit: Payer: 59 | Admitting: Primary Care

## 2017-05-09 ENCOUNTER — Telehealth: Payer: Self-pay

## 2017-05-09 DIAGNOSIS — E785 Hyperlipidemia, unspecified: Secondary | ICD-10-CM

## 2017-05-09 DIAGNOSIS — E119 Type 2 diabetes mellitus without complications: Secondary | ICD-10-CM

## 2017-05-09 NOTE — Telephone Encounter (Signed)
Pt has 6 mth f/u 05/19/17 with Allayne GitelmanK Clark NP.Please advise.

## 2017-05-09 NOTE — Telephone Encounter (Signed)
Copied from CRM [id number]. Topic: Appointment Scheduling - Scheduling Inquiry for Clinic >> May 09, 2017  2:15 PM Landry MellowFoltz, Melissa J wrote: Reason for CRM: pt would like orders for a1c.  Please call [phone]

## 2017-05-09 NOTE — Telephone Encounter (Signed)
Yes, needs fasting lab appointment 1-2 days prior, please schedule.

## 2017-05-10 NOTE — Telephone Encounter (Signed)
Spoken to patient and she stated that she will have to call back and schedule the lab appt. Patient have to ask her daughter as she is the one who can bring patient to office.

## 2017-05-17 ENCOUNTER — Other Ambulatory Visit (INDEPENDENT_AMBULATORY_CARE_PROVIDER_SITE_OTHER): Payer: 59

## 2017-05-17 DIAGNOSIS — E119 Type 2 diabetes mellitus without complications: Secondary | ICD-10-CM | POA: Diagnosis not present

## 2017-05-17 DIAGNOSIS — E785 Hyperlipidemia, unspecified: Secondary | ICD-10-CM | POA: Diagnosis not present

## 2017-05-17 LAB — COMPREHENSIVE METABOLIC PANEL
ALK PHOS: 64 U/L (ref 39–117)
ALT: 44 U/L — AB (ref 0–35)
AST: 41 U/L — ABNORMAL HIGH (ref 0–37)
Albumin: 4.5 g/dL (ref 3.5–5.2)
BILIRUBIN TOTAL: 0.6 mg/dL (ref 0.2–1.2)
BUN: 12 mg/dL (ref 6–23)
CO2: 30 meq/L (ref 19–32)
Calcium: 9.6 mg/dL (ref 8.4–10.5)
Chloride: 96 mEq/L (ref 96–112)
Creatinine, Ser: 0.86 mg/dL (ref 0.40–1.20)
GFR: 72.08 mL/min (ref >60.00)
Glucose, Bld: 275 mg/dL — ABNORMAL HIGH (ref 70–99)
POTASSIUM: 4 meq/L (ref 3.5–5.1)
Sodium: 136 mEq/L (ref 135–145)
Total Protein: 7.5 g/dL (ref 6.0–8.3)

## 2017-05-17 LAB — LIPID PANEL
CHOLESTEROL: 118 mg/dL (ref 0–200)
HDL: 26.2 mg/dL — ABNORMAL LOW (ref >39.00)
LDL CALC: 68 mg/dL (ref 0–99)
NonHDL: 91.89
TRIGLYCERIDES: 117 mg/dL (ref 0.0–149.0)
Total CHOL/HDL Ratio: 5
VLDL: 23.4 mg/dL (ref 0.0–40.0)

## 2017-05-17 LAB — HEMOGLOBIN A1C: Hgb A1c MFr Bld: 12.3 % — ABNORMAL HIGH (ref 4.6–6.5)

## 2017-05-19 ENCOUNTER — Ambulatory Visit (INDEPENDENT_AMBULATORY_CARE_PROVIDER_SITE_OTHER): Payer: 59 | Admitting: Primary Care

## 2017-05-19 ENCOUNTER — Other Ambulatory Visit: Payer: Self-pay | Admitting: Primary Care

## 2017-05-19 ENCOUNTER — Ambulatory Visit (INDEPENDENT_AMBULATORY_CARE_PROVIDER_SITE_OTHER)
Admission: RE | Admit: 2017-05-19 | Discharge: 2017-05-19 | Disposition: A | Discharge status: Home / Self Care | Payer: 59 | Source: Ambulatory Visit | Attending: Primary Care | Admitting: Primary Care

## 2017-05-19 ENCOUNTER — Encounter: Payer: Self-pay | Admitting: Primary Care

## 2017-05-19 VITALS — BP 118/74 | HR 91 | Temp 98.3°F | Wt 192.2 lb

## 2017-05-19 DIAGNOSIS — E785 Hyperlipidemia, unspecified: Secondary | ICD-10-CM | POA: Diagnosis not present

## 2017-05-19 DIAGNOSIS — M19012 Primary osteoarthritis, left shoulder: Secondary | ICD-10-CM | POA: Diagnosis not present

## 2017-05-19 DIAGNOSIS — R9389 Abnormal findings on diagnostic imaging of other specified body structures: Secondary | ICD-10-CM

## 2017-05-19 DIAGNOSIS — G8929 Other chronic pain: Secondary | ICD-10-CM | POA: Diagnosis not present

## 2017-05-19 DIAGNOSIS — M25512 Pain in left shoulder: Secondary | ICD-10-CM | POA: Diagnosis not present

## 2017-05-19 DIAGNOSIS — E119 Type 2 diabetes mellitus without complications: Secondary | ICD-10-CM

## 2017-05-19 MED ORDER — BLOOD GLUCOSE MONITOR KIT: PACK | 0 refills | Status: AC

## 2017-05-19 MED ORDER — PEN NEEDLES 31G X 6 MM MISC: 1 refills | Status: DC

## 2017-05-19 MED ORDER — BASAGLAR KWIKPEN 100 UNIT/ML ~~LOC~~ SOPN: 10.0000 [IU] | PEN_INJECTOR | Freq: Every day | SUBCUTANEOUS | 1 refills | Status: DC

## 2017-05-19 NOTE — Assessment & Plan Note (Signed)
Recent lipid panel stable, continue atorvastatin 20 mg.  

## 2017-05-19 NOTE — Assessment & Plan Note (Signed)
Also with significant decrease in range of motion for 5 months.  Differentials include osteoarthritis with moderate inflammation, rotator cuff tear.  Check plain films today.  May consider sending to sports medicine for steroid injections and/or evaluation.

## 2017-05-19 NOTE — Patient Instructions (Addendum)
Complete xray(s) prior to leaving today. I will notify you of your results once received.  Start the insulin. Inject 10 units into the skin every evening at bedtime.  Start checking your blood sugars twice daily: every morning before breakfast and every evening 2 hours after dinner. Take the prescription to your pharmacy.  It is important that you improve your diet. Please limit carbohydrates in the form of white bread, rice, pasta, sweets, fast food, fried food, sugary drinks, etc. Increase your consumption of fresh fruits and vegetables, whole grains, lean protein.  Ensure you are consuming 64 ounces of water daily.  Schedule a follow up visit in 6 weeks, bring your glucose readings.   It was a pleasure to see you today!   Diabetes Mellitus and Nutrition When you have diabetes (diabetes mellitus), it is very important to have healthy eating habits because your blood sugar (glucose) levels are greatly affected by what you eat and drink. Eating healthy foods in the appropriate amounts, at about the same times every day, can help you:  Control your blood glucose.  Lower your risk of heart disease.  Improve your blood pressure.  Reach or maintain a healthy weight.  Every person with diabetes is different, and each person has different needs for a meal plan. Your health care provider may recommend that you work with a diet and nutrition specialist (dietitian) to make a meal plan that is best for you. Your meal plan may vary depending on factors such as:  The calories you need.  The medicines you take.  Your weight.  Your blood glucose, blood pressure, and cholesterol levels.  Your activity level.  Other health conditions you have, such as heart or kidney disease.  How do carbohydrates affect me? Carbohydrates affect your blood glucose level more than any other type of food. Eating carbohydrates naturally increases the amount of glucose in your blood. Carbohydrate counting is a  method for keeping track of how many carbohydrates you eat. Counting carbohydrates is important to keep your blood glucose at a healthy level, especially if you use insulin or take certain oral diabetes medicines. It is important to know how many carbohydrates you can safely have in each meal. This is different for every person. Your dietitian can help you calculate how many carbohydrates you should have at each meal and for snack. Foods that contain carbohydrates include:  Bread, cereal, rice, pasta, and crackers.  Potatoes and corn.  Peas, beans, and lentils.  Milk and yogurt.  Fruit and juice.  Desserts, such as cakes, cookies, ice cream, and candy.  How does alcohol affect me? Alcohol can cause a sudden decrease in blood glucose (hypoglycemia), especially if you use insulin or take certain oral diabetes medicines. Hypoglycemia can be a life-threatening condition. Symptoms of hypoglycemia (sleepiness, dizziness, and confusion) are similar to symptoms of having too much alcohol. If your health care provider says that alcohol is safe for you, follow these guidelines:  Limit alcohol intake to no more than 1 drink per day for nonpregnant women and 2 drinks per day for men. One drink equals 12 oz of beer, 5 oz of wine, or 1 oz of hard liquor.  Do not drink on an empty stomach.  Keep yourself hydrated with water, diet soda, or unsweetened iced tea.  Keep in mind that regular soda, juice, and other mixers may contain a lot of sugar and must be counted as carbohydrates.  What are tips for following this plan? Reading food labels  Start by checking the serving size on the label. The amount of calories, carbohydrates, fats, and other nutrients listed on the label are based on one serving of the food. Many foods contain more than one serving per package.  Check the total grams (g) of carbohydrates in one serving. You can calculate the number of servings of carbohydrates in one serving by  dividing the total carbohydrates by 15. For example, if a food has 30 g of total carbohydrates, it would be equal to 2 servings of carbohydrates.  Check the number of grams (g) of saturated and trans fats in one serving. Choose foods that have low or no amount of these fats.  Check the number of milligrams (mg) of sodium in one serving. Most people should limit total sodium intake to less than 2,300 mg per day.  Always check the nutrition information of foods labeled as "low-fat" or "nonfat". These foods may be higher in added sugar or refined carbohydrates and should be avoided.  Talk to your dietitian to identify your daily goals for nutrients listed on the label. Shopping  Avoid buying canned, premade, or processed foods. These foods tend to be high in fat, sodium, and added sugar.  Shop around the outside edge of the grocery store. This includes fresh fruits and vegetables, bulk grains, fresh meats, and fresh dairy. Cooking  Use low-heat cooking methods, such as baking, instead of high-heat cooking methods like deep frying.  Cook using healthy oils, such as olive, canola, or sunflower oil.  Avoid cooking with butter, cream, or high-fat meats. Meal planning  Eat meals and snacks regularly, preferably at the same times every day. Avoid going long periods of time without eating.  Eat foods high in fiber, such as fresh fruits, vegetables, beans, and whole grains. Talk to your dietitian about how many servings of carbohydrates you can eat at each meal.  Eat 4-6 ounces of lean protein each day, such as lean meat, chicken, fish, eggs, or tofu. 1 ounce is equal to 1 ounce of meat, chicken, or fish, 1 egg, or 1/4 cup of tofu.  Eat some foods each day that contain healthy fats, such as avocado, nuts, seeds, and fish. Lifestyle   Check your blood glucose regularly.  Exercise at least 30 minutes 5 or more days each week, or as told by your health care provider.  Take medicines as told by  your health care provider.  Do not use any products that contain nicotine or tobacco, such as cigarettes and e-cigarettes. If you need help quitting, ask your health care provider.  Work with a Veterinary surgeoncounselor or diabetes educator to identify strategies to manage stress and any emotional and social challenges. What are some questions to ask my health care provider?  Do I need to meet with a diabetes educator?  Do I need to meet with a dietitian?  What number can I call if I have questions?  When are the best times to check my blood glucose? Where to find more information:  American Diabetes Association: diabetes.org/food-and-fitness/food  Academy of Nutrition and Dietetics: https://www.vargas.com/www.eatright.org/resources/health/diseases-and-conditions/diabetes  [employer] of Diabetes and Digestive and Kidney Diseases (NIH): FindJewelers.czwww.niddk.nih.gov/health-information/diabetes/overview/diet-eating-physical-activity Summary  A healthy meal plan will help you control your blood glucose and maintain a healthy lifestyle.  Working with a diet and nutrition specialist (dietitian) can help you make a meal plan that is best for you.  Keep in mind that carbohydrates and alcohol have immediate effects on your blood glucose levels. It is important to count  carbohydrates and to use alcohol carefully. This information is not intended to replace advice given to you by your health care provider. Make sure you discuss any questions you have with your health care provider. Document Released: 12/17/2004 Document Revised: 04/26/2016 Document Reviewed: 04/26/2016 Elsevier Interactive Patient Education  [employer].

## 2017-05-19 NOTE — Assessment & Plan Note (Addendum)
Significant increase in A1c from 8.1 in September 2018 to 12.3 recently.  Suspect this is secondary to persistent intake of sodas.  Given high A1c reading on to oral agents will need to start insulin to aggressively treat diabetes.  Prescription for glucometer, lancets, test strips provided, she will take this to her pharmacy.  Prescription for basaglar insulin sent to pharmacy.  Inject 10 units at bedtime.  Discussed to start checking her blood sugars twice daily.  Continue metformin and glipizide.  Follow-up in 6 weeks with blood sugar logs.

## 2017-05-19 NOTE — Progress Notes (Signed)
Subjective:    Patient ID: Morgan Small, female    DOB: 1960/07/12, 57 y.o.   MRN: [id number]  HPI  Morgan Small is a 57 year old female who presents today for follow up and a chief complaint of left shoulder pain.  1) Type 2 Diabetes:  Current medications include: Glipizide 10 mg BID, Metformin 1000 mg BID.  She does not check her blood sugars.  She does not have a glucometer.  Last A1C: 12.3 in February 2019, 8.1 in September 2018 Last Eye Exam: Completed in Fall 2018 Last Foot Exam: Due today Pneumonia Vaccination: Completed pneumovax in 2018 ACE/ARB: lisinopril  Statin: atorvastatin  Diet currently consists of:  Breakfast: Oatmeal Lunch: Sandwich Dinner: Vegetables, beans, potatoes, chicken, hamburger Snacks: Fruit, occasionally cookie Desserts: Rarely  Beverages: Soda mostly up until 4 weeks ago, now drinking sugar free flavored water.   Exercise: She does not currently exercise.   2) Shoulder Pain: Located to the left shoulder that is located across the anterior and posterior shoulder. Also with decrease in ROM in most planes of abduction. Symptoms began 5 months ago after she was reaching back behind to close her bathroom door. She's taking tylenol without improvement. She can't take NSAID's due to GI ulcer history. She does experience intermittent shooting pain to her left upper extremity, denies numbness/tingling.    Review of Systems  Eyes: Negative for visual disturbance.  Respiratory: Negative for shortness of breath.   Cardiovascular: Negative for chest pain.  Musculoskeletal:       Chronic left shoulder pain  Skin: Negative for color change.  Neurological: Negative for dizziness and numbness.       Past Medical History:  Diagnosis Date  . Arthritis   . Depression   . Diabetes mellitus without complication (HCC)   . Diverticulitis   . Esophageal ulcer   . GAD (generalized anxiety disorder)   . GERD (gastroesophageal reflux disease)   .  Hyperlipidemia   . Hypertension      Social History   Socioeconomic History  . Marital status: Married    Spouse name: Not on file  . Number of children: Not on file  . Years of education: Not on file  . Highest education level: Not on file  Social Needs  . Financial resource strain: Not on file  . Food insecurity - worry: Not on file  . Food insecurity - inability: Not on file  . Transportation needs - medical: Not on file  . Transportation needs - non-medical: Not on file  Occupational History  . Not on file  Tobacco Use  . Smoking status: Current Every Day Smoker    Packs/day: 1.00    Types: Cigarettes  . Smokeless tobacco: Never Used  Substance and Sexual Activity  . Alcohol use: Yes    Comment: occasional   . Drug use: Yes  . Sexual activity: Not on file  Other Topics Concern  . Not on file  Social History Narrative  . Not on file    Past Surgical History:  Procedure Laterality Date  . APPENDECTOMY  1994  . COLONOSCOPY WITH PROPOFOL N/A 02/03/2015   Procedure: COLONOSCOPY WITH PROPOFOL;  Surgeon: Wallace CullensPaul Y Oh, MD;  [facility];  Service: Gastroenterology;  Laterality: N/A;  . ESOPHAGOGASTRODUODENOSCOPY (EGD) WITH PROPOFOL N/A 02/03/2015   Procedure: ESOPHAGOGASTRODUODENOSCOPY (EGD) WITH PROPOFOL;  Surgeon: Wallace CullensPaul Y Oh, MD;  [facility];  Service: Gastroenterology;  Laterality: N/A;  . GALLBLADDER SURGERY  1989  Family History  Problem Relation Age of Onset  . Arthritis Mother   . Hyperlipidemia Mother   . Hypertension Mother   . Mental illness Mother   . Diabetes Mother   . Arthritis Father   . Hypertension Father   . Mental illness Father   . Alcohol abuse Maternal Grandmother   . Learning disabilities Maternal Grandmother   . Learning disabilities Maternal Grandfather     No Known Allergies  Current Outpatient Medications on File Prior to Visit  Medication Sig Dispense Refill  . atorvastatin (LIPITOR) 20 MG tablet TAKE 1  TABLET (20 MG TOTAL) BY MOUTH DAILY AT 6 PM. 90 tablet 1  . glipiZIDE (GLUCOTROL) 10 MG tablet Take 1 tablet (10 mg total) by mouth 2 (two) times daily before a meal. 180 tablet 3  . lisinopril-hydrochlorothiazide (PRINZIDE,ZESTORETIC) 20-12.5 MG tablet TAKE 1 TABLET BY MOUTH DAILY. 90 tablet 1  . metFORMIN (GLUCOPHAGE) 1000 MG tablet TAKE 1 TABLET (1,000 MG TOTAL) BY MOUTH DAILY WITH BREAKFAST. 180 tablet 1  . mirtazapine (REMERON) 30 MG tablet TAKE 1 TABLET BY MOUTH AT BEDTIME 30 tablet 0  . sertraline (ZOLOFT) 50 MG tablet Take 50 mg by mouth daily.    Marland Kitchen amoxicillin-clavulanate (AUGMENTIN) 875-125 MG tablet Take 1 tablet by mouth 2 (two) times daily. 20 tablet 0  . sucralfate (CARAFATE) 1 g tablet TAKE ONE TABLET BY MOUTH 4 TIMES DAILY BEFORE MEAL(S) AND NIGHTLY    . vitamin B-12 (CYANOCOBALAMIN) 1000 MCG tablet Take 1,000 mcg by mouth daily.     No current facility-administered medications on file prior to visit.     BP 118/74 (BP Location: Left Arm, Patient Position: Sitting, Cuff Size: Normal)   Pulse 91   Temp 98.3 F (36.8 C) (Oral)   Wt 192 lb 4 oz (87.2 kg)   SpO2 96%   BMI 31.99 kg/m    Objective:   Physical Exam  Constitutional: She appears well-nourished.  Neck: Neck supple.  Cardiovascular: Normal rate and regular rhythm.  Pulmonary/Chest: Effort normal and breath sounds normal.  Musculoskeletal:       Left shoulder: She exhibits decreased range of motion and pain. She exhibits no tenderness.  Moderate decrease in range of motion with abduction and most planes including lateral posterior and forward. Strength 5 out of 5 to right upper extremity, 4 out of 5 to left upper extremity  Skin: Skin is warm and dry.          Assessment & Plan:

## 2017-05-20 ENCOUNTER — Encounter: Payer: Self-pay | Admitting: Primary Care

## 2017-05-20 ENCOUNTER — Ambulatory Visit (INDEPENDENT_AMBULATORY_CARE_PROVIDER_SITE_OTHER)
Admission: RE | Admit: 2017-05-20 | Discharge: 2017-05-20 | Disposition: A | Discharge status: Home / Self Care | Payer: 59 | Source: Ambulatory Visit | Attending: Primary Care | Admitting: Primary Care

## 2017-05-20 DIAGNOSIS — R918 Other nonspecific abnormal finding of lung field: Secondary | ICD-10-CM | POA: Diagnosis not present

## 2017-05-20 DIAGNOSIS — R9389 Abnormal findings on diagnostic imaging of other specified body structures: Secondary | ICD-10-CM | POA: Diagnosis not present

## 2017-06-06 ENCOUNTER — Other Ambulatory Visit: Payer: Self-pay | Admitting: Primary Care

## 2017-06-06 DIAGNOSIS — I10 Essential (primary) hypertension: Secondary | ICD-10-CM

## 2017-06-16 ENCOUNTER — Other Ambulatory Visit: Payer: Self-pay | Admitting: Primary Care

## 2017-06-30 ENCOUNTER — Ambulatory Visit: Payer: 59 | Admitting: Primary Care

## 2017-08-02 ENCOUNTER — Other Ambulatory Visit: Payer: Self-pay | Admitting: Primary Care

## 2017-08-02 DIAGNOSIS — E119 Type 2 diabetes mellitus without complications: Secondary | ICD-10-CM

## 2017-08-03 NOTE — Telephone Encounter (Signed)
Patient overdue for follow up. Irving Burton, will you please schedule her with a lab and office visit appointment with labs 1-2 days prior to her appointment date? Lab appointment needs to be after May 12th.  She'll need to bring her glucose logs to her visit.

## 2017-10-21 ENCOUNTER — Other Ambulatory Visit: Payer: Self-pay | Admitting: Primary Care

## 2017-10-21 DIAGNOSIS — E119 Type 2 diabetes mellitus without complications: Secondary | ICD-10-CM

## 2017-12-12 ENCOUNTER — Other Ambulatory Visit: Payer: Self-pay | Admitting: Primary Care

## 2017-12-12 DIAGNOSIS — I10 Essential (primary) hypertension: Secondary | ICD-10-CM

## 2018-01-02 ENCOUNTER — Other Ambulatory Visit: Payer: Self-pay | Admitting: Primary Care

## 2018-01-02 DIAGNOSIS — E119 Type 2 diabetes mellitus without complications: Secondary | ICD-10-CM

## 2018-01-03 NOTE — Telephone Encounter (Signed)
Please call patient and schedule a follow up visit for diabetes. Also is she taking both glimepiride and glipizide? She shouldn't so please check. Thanks.

## 2018-01-03 NOTE — Telephone Encounter (Signed)
Electronic refill request Glipizide Last office visit 05/19/16 was to follow-up Cancelled appointment 06/30/17 Last refill 12/31/16 #180/3

## 2018-01-04 NOTE — Telephone Encounter (Signed)
Message left for patient to return my call.  

## 2018-01-10 NOTE — Telephone Encounter (Signed)
Per DPR, left detail message of [name]'s comments for patient to call back 

## 2018-01-15 ENCOUNTER — Other Ambulatory Visit: Payer: Self-pay | Admitting: Primary Care

## 2018-01-15 DIAGNOSIS — E119 Type 2 diabetes mellitus without complications: Secondary | ICD-10-CM

## 2018-01-20 NOTE — Telephone Encounter (Signed)
Tired to call patient but voicemail box is full.

## 2018-01-23 NOTE — Telephone Encounter (Signed)
It appears she's on glimepiride so I will refuse refill for glipizide.  Please send letter stating that she needs to come to our office for re-evaluation of diabetes.

## 2018-01-23 NOTE — Telephone Encounter (Signed)
Morgan Small, what do want to do regarding this patient?

## 2018-02-14 ENCOUNTER — Telehealth: Payer: Self-pay | Admitting: *Deleted

## 2018-02-14 NOTE — Telephone Encounter (Signed)
Spoke to pts daughter who states she is concerned about her mother's uncontrolled DM. Her fasting BS was 376 this am, and during the day, has reached almost 500. Pt is c/o tingling in her fingers, frequent urination, and being thirsty. Due to loss of insurance, pt has not been able to get her meds and has not taken metformin "in a few weeks." Advised that based on previous OV note, pt was to f/u in 6wks with her DM logs, but has been unable to do so. Pt has been scheduled for 11/12 and daughter was advised should pts s/s increase or she begin to worsen, pls take her directly to the ED.

## 2018-02-14 NOTE — Telephone Encounter (Signed)
Noted and agree. 

## 2018-02-15 ENCOUNTER — Encounter: Payer: Self-pay | Admitting: *Deleted

## 2018-02-15 ENCOUNTER — Encounter: Payer: Self-pay | Admitting: Primary Care

## 2018-02-15 ENCOUNTER — Ambulatory Visit: Payer: Self-pay | Admitting: Primary Care

## 2018-02-15 VITALS — BP 124/74 | HR 78 | Temp 98.2°F | Ht 65.0 in | Wt 175.5 lb

## 2018-02-15 DIAGNOSIS — F32A Depression, unspecified: Secondary | ICD-10-CM

## 2018-02-15 DIAGNOSIS — F419 Anxiety disorder, unspecified: Secondary | ICD-10-CM

## 2018-02-15 DIAGNOSIS — E119 Type 2 diabetes mellitus without complications: Secondary | ICD-10-CM

## 2018-02-15 DIAGNOSIS — I10 Essential (primary) hypertension: Secondary | ICD-10-CM

## 2018-02-15 DIAGNOSIS — E785 Hyperlipidemia, unspecified: Secondary | ICD-10-CM

## 2018-02-15 DIAGNOSIS — F329 Major depressive disorder, single episode, unspecified: Secondary | ICD-10-CM

## 2018-02-15 LAB — POCT GLYCOSYLATED HEMOGLOBIN (HGB A1C): Hemoglobin A1C: 13.2 % — AB (ref 4.0–5.6)

## 2018-02-15 MED ORDER — SERTRALINE HCL 50 MG PO TABS: 50.0000 mg | ORAL_TABLET | Freq: Every day | ORAL | 5 refills | Status: DC

## 2018-02-15 MED ORDER — MIRTAZAPINE 30 MG PO TABS: 30.0000 mg | ORAL_TABLET | Freq: Every day | ORAL | 5 refills | Status: DC

## 2018-02-15 MED ORDER — LISINOPRIL 10 MG PO TABS: 10.0000 mg | ORAL_TABLET | Freq: Every day | ORAL | 5 refills | Status: DC

## 2018-02-15 MED ORDER — GLIPIZIDE 10 MG PO TABS: 10.0000 mg | ORAL_TABLET | Freq: Two times a day (BID) | ORAL | 5 refills | Status: DC

## 2018-02-15 MED ORDER — BASAGLAR KWIKPEN 100 UNIT/ML ~~LOC~~ SOPN: 15.0000 [IU] | PEN_INJECTOR | Freq: Every day | SUBCUTANEOUS | 0 refills | Status: DC

## 2018-02-15 MED ORDER — PEN NEEDLES 31G X 6 MM MISC: 5 refills | Status: DC

## 2018-02-15 MED ORDER — METFORMIN HCL 1000 MG PO TABS: 1000.0000 mg | ORAL_TABLET | Freq: Two times a day (BID) | ORAL | 5 refills | Status: DC

## 2018-02-15 MED ORDER — ATORVASTATIN CALCIUM 20 MG PO TABS
20.0000 mg | ORAL_TABLET | Freq: Every evening | ORAL | 5 refills | Status: DC
Start: 2018-02-15 — End: 2018-08-11

## 2018-02-15 NOTE — Assessment & Plan Note (Signed)
Refills provided for atorvastatin.

## 2018-02-15 NOTE — Assessment & Plan Note (Signed)
Off of medications for one month. BP stable in the office today. Will stop lisinopril-HCTZ 20-12.5. Start lisinopril 10 mg. BMP up to date. Recommended she start monitoring BP at home. Follow up in 1 month.

## 2018-02-15 NOTE — Patient Instructions (Signed)
Resume your medications.  Glipizide 10 mg, take 1 tablet twice daily with meals. Metformin 1000 mg, take 1 tablet twice daily with meals. Basaglar, inject 15 units into the skin nightly.  Start checking your blood sugars 2-3 times daily, rotating times. Before breakfast, lunch, dinner 2 hours after breakfast, lunch, dinner Bedtime   Record your readings and bring them to your next visit.  Stop taking lisinopril-hydrochlorothiazide. Start taking lisinopril 10 mg daily for blood pressure and kidney protection.   Schedule a follow up visit for 4 weeks for diabetes check.  It was a pleasure to see you today!   Diabetes Mellitus and Nutrition When you have diabetes (diabetes mellitus), it is very important to have healthy eating habits because your blood sugar (glucose) levels are greatly affected by what you eat and drink. Eating healthy foods in the appropriate amounts, at about the same times every day, can help you:  Control your blood glucose.  Lower your risk of heart disease.  Improve your blood pressure.  Reach or maintain a healthy weight.  Every person with diabetes is different, and each person has different needs for a meal plan. Your health care provider may recommend that you work with a diet and nutrition specialist (dietitian) to make a meal plan that is best for you. Your meal plan may vary depending on factors such as:  The calories you need.  The medicines you take.  Your weight.  Your blood glucose, blood pressure, and cholesterol levels.  Your activity level.  Other health conditions you have, such as heart or kidney disease.  How do carbohydrates affect me? Carbohydrates affect your blood glucose level more than any other type of food. Eating carbohydrates naturally increases the amount of glucose in your blood. Carbohydrate counting is a method for keeping track of how many carbohydrates you eat. Counting carbohydrates is important to keep your blood  glucose at a healthy level, especially if you use insulin or take certain oral diabetes medicines. It is important to know how many carbohydrates you can safely have in each meal. This is different for every person. Your dietitian can help you calculate how many carbohydrates you should have at each meal and for snack. Foods that contain carbohydrates include:  Bread, cereal, rice, pasta, and crackers.  Potatoes and corn.  Peas, beans, and lentils.  Milk and yogurt.  Fruit and juice.  Desserts, such as cakes, cookies, ice cream, and candy.  How does alcohol affect me? Alcohol can cause a sudden decrease in blood glucose (hypoglycemia), especially if you use insulin or take certain oral diabetes medicines. Hypoglycemia can be a life-threatening condition. Symptoms of hypoglycemia (sleepiness, dizziness, and confusion) are similar to symptoms of having too much alcohol. If your health care provider says that alcohol is safe for you, follow these guidelines:  Limit alcohol intake to no more than 1 drink per day for nonpregnant women and 2 drinks per day for men. One drink equals 12 oz of beer, 5 oz of wine, or 1 oz of hard liquor.  Do not drink on an empty stomach.  Keep yourself hydrated with water, diet soda, or unsweetened iced tea.  Keep in mind that regular soda, juice, and other mixers may contain a lot of sugar and must be counted as carbohydrates.  What are tips for following this plan? Reading food labels  Start by checking the serving size on the label. The amount of calories, carbohydrates, fats, and other nutrients listed on the label are  based on one serving of the food. Many foods contain more than one serving per package.  Check the total grams (g) of carbohydrates in one serving. You can calculate the number of servings of carbohydrates in one serving by dividing the total carbohydrates by 15. For example, if a food has 30 g of total carbohydrates, it would be equal to  2 servings of carbohydrates.  Check the number of grams (g) of saturated and trans fats in one serving. Choose foods that have low or no amount of these fats.  Check the number of milligrams (mg) of sodium in one serving. Most people should limit total sodium intake to less than 2,300 mg per day.  Always check the nutrition information of foods labeled as "low-fat" or "nonfat". These foods may be higher in added sugar or refined carbohydrates and should be avoided.  Talk to your dietitian to identify your daily goals for nutrients listed on the label. Shopping  Avoid buying canned, premade, or processed foods. These foods tend to be high in fat, sodium, and added sugar.  Shop around the outside edge of the grocery store. This includes fresh fruits and vegetables, bulk grains, fresh meats, and fresh dairy. Cooking  Use low-heat cooking methods, such as baking, instead of high-heat cooking methods like deep frying.  Cook using healthy oils, such as olive, canola, or sunflower oil.  Avoid cooking with butter, cream, or high-fat meats. Meal planning  Eat meals and snacks regularly, preferably at the same times every day. Avoid going long periods of time without eating.  Eat foods high in fiber, such as fresh fruits, vegetables, beans, and whole grains. Talk to your dietitian about how many servings of carbohydrates you can eat at each meal.  Eat 4-6 ounces of lean protein each day, such as lean meat, chicken, fish, eggs, or tofu. 1 ounce is equal to 1 ounce of meat, chicken, or fish, 1 egg, or 1/4 cup of tofu.  Eat some foods each day that contain healthy fats, such as avocado, nuts, seeds, and fish. Lifestyle   Check your blood glucose regularly.  Exercise at least 30 minutes 5 or more days each week, or as told by your health care provider.  Take medicines as told by your health care provider.  Do not use any products that contain nicotine or tobacco, such as cigarettes and  e-cigarettes. If you need help quitting, ask your health care provider.  Work with a Veterinary surgeoncounselor or diabetes educator to identify strategies to manage stress and any emotional and social challenges. What are some questions to ask my health care provider?  Do I need to meet with a diabetes educator?  Do I need to meet with a dietitian?  What number can I call if I have questions?  When are the best times to check my blood glucose? Where to find more information:  American Diabetes Association: diabetes.org/food-and-fitness/food  Academy of Nutrition and Dietetics: https://www.vargas.com/www.eatright.org/resources/health/diseases-and-conditions/diabetes  [employer] Institute of Diabetes and Digestive and Kidney Diseases (NIH): FindJewelers.czwww.niddk.nih.gov/health-information/diabetes/overview/diet-eating-physical-activity Summary  A healthy meal plan will help you control your blood glucose and maintain a healthy lifestyle.  Working with a diet and nutrition specialist (dietitian) can help you make a meal plan that is best for you.  Keep in mind that carbohydrates and alcohol have immediate effects on your blood glucose levels. It is important to count carbohydrates and to use alcohol carefully. This information is not intended to replace advice given to you by your health care provider. Make  sure you discuss any questions you have with your health care provider. Document Released: 12/17/2004 Document Revised: 04/26/2016 Document Reviewed: 04/26/2016 Elsevier Interactive Patient Education  [employer].

## 2018-02-15 NOTE — Assessment & Plan Note (Signed)
Doing well on Zoloft and Mirtazapine, continue same. Refills provided. Denies SI/HI.

## 2018-02-15 NOTE — Assessment & Plan Note (Signed)
Uncontrolled, stopped all medications one month ago. Refills provided for Metformin, Glipizide, and Basaglar.  A1C today of 13.2 today.  Increase Basaglar to 15 units daily. Resume Metformin and Glipizide.  Discussed to start checking glucose 2-3 times daily, rotating times. Glucose log provided.  Strongly advised she see the eye doctor. Foot exam UTD. Pneumonia vaccination UTD. Managed on statin and ACE.  Follow up in 1 month with glucose logs.

## 2018-02-15 NOTE — Progress Notes (Signed)
Subjective:    Patient ID: Morgan Small, female    DOB: [birth year], 57 y.o.   MRN: [id number]  HPI  Ms. Morgan Small is a 57 year old female who presents today for follow up of type 2 diabetes.  Current medications include: Glipizide 10 mg BID, Basaglar 10 units HS, Metformin 1000 mg BID.   She is not injecting her insulin as she ran out of needles. She has not been taking Metformin or Glipizide in about one month.   She has not been checking her blood glucose. She recently restarted checking her glucose.  Saturday AM: 376 Saturday Evening 2 hours after dinner: 473  Yesterday AM: 286  Last A1C: 12.3 in February 2019, 13.2 today Last Eye Exam: No exam in 2019 Last Foot Exam: Due in February 2020 Pneumonia Vaccination: Completed in 2018 ACE/ARB: lisinopril  Statin: atorvastatin   Diet currently consists of:  Breakfast: Cereal Lunch: Skips, Sandwich Dinner: Vegetable, meat, starch Snacks: None Desserts: None  Beverages: Soda, water  Exercise: She is not exercising  BP Readings from Last 3 Encounters:  02/15/18 124/74  05/19/17 118/74  09/13/16 128/82   She ran out of her lisinopril-HCTZ one month ago, has not been checking BP at home.   Review of Systems  Constitutional: Positive for fatigue.  Eyes: Negative for visual disturbance.  Respiratory: Negative for shortness of breath.   Cardiovascular: Negative for chest pain.  Endocrine: Positive for polydipsia and polyuria.  Neurological: Negative for numbness.  Psychiatric/Behavioral: Negative for suicidal ideas.       Past Medical History:  Diagnosis Date  . Arthritis   . Depression   . Diabetes mellitus without complication ([place])   . Diverticulitis   . Esophageal ulcer   . GAD (generalized anxiety disorder)   . GERD (gastroesophageal reflux disease)   . Hyperlipidemia   . Hypertension      Social History   Socioeconomic History  . Marital status: Married    Spouse name: Not on file  . Number of  children: Not on file  . Years of education: Not on file  . Highest education level: Not on file  Occupational History  . Not on file  Social Needs  . Financial resource strain: Not on file  . Food insecurity:    Worry: Not on file    Inability: Not on file  . Transportation needs:    Medical: Not on file    Non-medical: Not on file  Tobacco Use  . Smoking status: Current Every Day Smoker    Packs/day: 1.00    Types: Cigarettes  . Smokeless tobacco: Never Used  Substance and Sexual Activity  . Alcohol use: Yes    Comment: occasional   . Drug use: Yes  . Sexual activity: Not on file  Lifestyle  . Physical activity:    Days per week: Not on file    Minutes per session: Not on file  . Stress: Not on file  Relationships  . Social connections:    Talks on phone: Not on file    Gets together: Not on file    Attends religious service: Not on file    Active member of club or organization: Not on file    Attends meetings of clubs or organizations: Not on file    Relationship status: Not on file  . Intimate partner violence:    Fear of current or ex partner: Not on file    Emotionally abused: Not on file  Physically abused: Not on file    Forced sexual activity: Not on file  Other Topics Concern  . Not on file  Social History Narrative  . Not on file    Past Surgical History:  Procedure Laterality Date  . APPENDECTOMY  1994  . COLONOSCOPY WITH PROPOFOL N/A 02/03/2015   Procedure: COLONOSCOPY WITH PROPOFOL;  Surgeon: Hulen Luster, MD;  Location: [facility] ENDOSCOPY;  Service: Gastroenterology;  Laterality: N/A;  . ESOPHAGOGASTRODUODENOSCOPY (EGD) WITH PROPOFOL N/A 02/03/2015   Procedure: ESOPHAGOGASTRODUODENOSCOPY (EGD) WITH PROPOFOL;  Surgeon: Hulen Luster, MD;  Location: [facility] ENDOSCOPY;  Service: Gastroenterology;  Laterality: N/A;  . GALLBLADDER SURGERY  1989    Family History  Problem Relation Age of Onset  . Arthritis Mother   . Hyperlipidemia Mother   . Hypertension  Mother   . Mental illness Mother   . Diabetes Mother   . Arthritis Father   . Hypertension Father   . Mental illness Father   . Alcohol abuse Maternal Grandmother   . Learning disabilities Maternal Grandmother   . Learning disabilities Maternal Grandfather     No Known Allergies  Current Outpatient Medications on File Prior to Visit  Medication Sig Dispense Refill  . ACCU-CHEK AVIVA PLUS test strip USE TO TEST BLOOD SUGAR EVERY MORNING AND 2HRS AFTER DINNER 100 each 1  . blood glucose meter kit and supplies KIT Test every morning before breakfast and 2 hours after dinner. 1 each 0  . lisinopril-hydrochlorothiazide (PRINZIDE,ZESTORETIC) 20-12.5 MG tablet TAKE 1 TABLET BY MOUTH EVERY DAY 90 tablet 1  . vitamin B-12 (CYANOCOBALAMIN) 1000 MCG tablet Take 1,000 mcg by mouth daily.     No current facility-administered medications on file prior to visit.     BP 124/74   Pulse 78   Temp 98.2 F (36.8 C) (Oral)   Ht _0  (1.651 m)   Wt 175 lb 8 oz (79.6 kg)   SpO2 98%   BMI 29.20 kg/m    Objective:   Physical Exam  Constitutional: She appears well-nourished.  Neck: Neck supple.  Cardiovascular: Normal rate and regular rhythm.  Respiratory: Effort normal and breath sounds normal.  Skin: Skin is warm and dry.  Psychiatric: She has a normal mood and affect.           Assessment & Plan:

## 2018-03-15 ENCOUNTER — Ambulatory Visit: Payer: Self-pay | Admitting: Primary Care

## 2018-03-16 ENCOUNTER — Ambulatory Visit: Payer: Self-pay | Admitting: Primary Care

## 2018-04-14 ENCOUNTER — Other Ambulatory Visit: Payer: Self-pay | Admitting: Primary Care

## 2018-04-14 DIAGNOSIS — E119 Type 2 diabetes mellitus without complications: Secondary | ICD-10-CM

## 2018-04-17 ENCOUNTER — Ambulatory Visit: Payer: Self-pay | Admitting: Primary Care

## 2018-06-09 ENCOUNTER — Other Ambulatory Visit: Payer: Self-pay | Admitting: Primary Care

## 2018-06-09 DIAGNOSIS — I10 Essential (primary) hypertension: Secondary | ICD-10-CM

## 2018-07-05 ENCOUNTER — Telehealth: Payer: Self-pay | Admitting: Primary Care

## 2018-07-05 NOTE — Telephone Encounter (Signed)
Called to schedule follow up with Morgan Small. Pt does not have video capability but she does not want to come into office right now. She says she is scared and is staying away from the public until everything calms down. She states she is feeling better than she has in a long time, though.

## 2018-07-05 NOTE — Telephone Encounter (Signed)
Can we at least get her scheduled for mid May 2020? If she refuses then June is fine.

## 2018-08-11 ENCOUNTER — Other Ambulatory Visit: Payer: Self-pay | Admitting: Primary Care

## 2018-08-11 DIAGNOSIS — E785 Hyperlipidemia, unspecified: Secondary | ICD-10-CM

## 2018-08-11 DIAGNOSIS — E119 Type 2 diabetes mellitus without complications: Secondary | ICD-10-CM

## 2018-09-05 ENCOUNTER — Other Ambulatory Visit: Payer: Self-pay | Admitting: Primary Care

## 2018-09-05 DIAGNOSIS — F32A Depression, unspecified: Secondary | ICD-10-CM

## 2018-09-05 DIAGNOSIS — E119 Type 2 diabetes mellitus without complications: Secondary | ICD-10-CM

## 2018-09-05 DIAGNOSIS — E785 Hyperlipidemia, unspecified: Secondary | ICD-10-CM

## 2018-09-05 DIAGNOSIS — F419 Anxiety disorder, unspecified: Secondary | ICD-10-CM

## 2018-09-05 DIAGNOSIS — F329 Major depressive disorder, single episode, unspecified: Secondary | ICD-10-CM

## 2018-09-05 NOTE — Telephone Encounter (Signed)
Patient will need an office visit for uncontrolled diabetes prior to any additional refills for diabetes medications. Will provide her with 30 day supply until she can be seen. Please schedule.

## 2018-09-05 NOTE — Telephone Encounter (Signed)
Pt LOV was over 6 mo ago. Please advise. Thanks

## 2018-09-05 NOTE — Telephone Encounter (Signed)
Attempted to reach patient. No answer and mailbox is full. 

## 2018-10-03 ENCOUNTER — Encounter: Payer: Self-pay | Admitting: Primary Care

## 2018-10-03 ENCOUNTER — Other Ambulatory Visit: Payer: Self-pay

## 2018-10-03 ENCOUNTER — Ambulatory Visit (INDEPENDENT_AMBULATORY_CARE_PROVIDER_SITE_OTHER): Payer: Self-pay | Admitting: Primary Care

## 2018-10-03 DIAGNOSIS — E785 Hyperlipidemia, unspecified: Secondary | ICD-10-CM

## 2018-10-03 DIAGNOSIS — I10 Essential (primary) hypertension: Secondary | ICD-10-CM

## 2018-10-03 DIAGNOSIS — F329 Major depressive disorder, single episode, unspecified: Secondary | ICD-10-CM

## 2018-10-03 DIAGNOSIS — E119 Type 2 diabetes mellitus without complications: Secondary | ICD-10-CM

## 2018-10-03 DIAGNOSIS — F419 Anxiety disorder, unspecified: Secondary | ICD-10-CM

## 2018-10-03 DIAGNOSIS — F32A Depression, unspecified: Secondary | ICD-10-CM

## 2018-10-03 MED ORDER — LISINOPRIL 10 MG PO TABS: 10.0000 mg | ORAL_TABLET | Freq: Every day | ORAL | 3 refills | Status: DC

## 2018-10-03 MED ORDER — SERTRALINE HCL 100 MG PO TABS: 100.0000 mg | ORAL_TABLET | Freq: Every day | ORAL | 1 refills | Status: DC

## 2018-10-03 NOTE — Assessment & Plan Note (Signed)
Compliant to lisinopril 10 mg. Verified that she is not taking lisinopril-HCTZ which was refilled to her pharmacy by our [place] staff. Will discontinue lisinopril-HCTZ from med list. BMP pending.

## 2018-10-03 NOTE — Assessment & Plan Note (Signed)
Repeat A1C pending, is not checking glucose regularly. Recent glucose reading above goal. Has not had insulin in several months as she cannot afford.   Continue Metformin and Glipizide as prescribed. Foot exam next visit. Pneumonia vaccination UTD. Recommended she schedule an eye exam.  Await labs. Consider Novolin N 70/30 insulin OTC once labs return.

## 2018-10-03 NOTE — Assessment & Plan Note (Signed)
Increased since Covid-19, dose feel that medications do help. Agree that a dose increase may help her through. Increase Zoloft to 100 mg. Continue Remeron at 30 mg. She will update.

## 2018-10-03 NOTE — Assessment & Plan Note (Signed)
Repeat lipid panel pending, compliant to atorvastatin. Continue same.  

## 2018-10-03 NOTE — Patient Instructions (Signed)
Call the main line to schedule your lab appointment as discussed. Make sure to fast 4 hours prior.  Continue Metformin and Glipizide for now.  We've increased the dose of your sertraline (Zoloft) to 100 mg. I sent a new prescription to the pharmacy.  Make sure you are only taking lisinopril 10 mg once daily for blood pressure.   You can try taking meclizine 12.5 mg tablets as needed for vertigo like symptoms. Please update me if your dizziness does not continue to improve.  It was a pleasure to see you today!

## 2018-10-03 NOTE — Progress Notes (Signed)
Subjective:    Patient ID: Morgan Small, female    DOB: [birth year], 59 y.o.   MRN: [id number]  HPI  Virtual Visit via Video Note  I connected with [name] on 10/03/18 at  9:20 AM EDT by a video enabled telemedicine application and verified that I am speaking with the correct person using two identifiers.  Location: Patient: Home Provider: Office   I discussed the limitations of evaluation and management by telemedicine and the availability of in person appointments. The patient expressed understanding and agreed to proceed.  History of Present Illness:  [name] is a 58 year old female who presents today for follow up of diabetes. She'd also like to discuss dizziness.   1) Type 2 Diabetes: Current medications include: Glipizide 10 mg BID, Basaglar 15 units HS, Metformin 1000 mg BID. She no longer has insurance so she stopped taking her insulin 3-4 months ago.  She is checking her blood glucose 0 times daily on average.  Her daughter checked her blood sugar 2 hours after lunch two days ago which was 350.   Last A1C: 13.2 in November 2019, she has not followed up as recommended. Repeat A1C pending. Last Eye Exam: No recent exam Last Foot Exam:  Due next visit Pneumonia Vaccination: Completed last in 2018 ACE/ARB: Lisinopril  Statin: Lipitor  2) Dizziness: One week ago she started feeling lightheaded and dizzy, the room was spinning, and she had to hold onto objects in order to walk. Her symptoms improved with sitting down. Symptoms are overall better, but still come and go. She denies chest pain, shortness of breath. She's not taken anything OTC for her symptoms.   3) Anxiety and Depression: Increased anxiety symptoms over the last 3-4 months since Covid-19. Her symptoms include feeling anxious and nervous, daily worry, decreased ability to handles stress. She is compliant to her Zoloft 50 mg, Remeron 30 mg and does well but thinks she may need a dose increase of her  Zoloft 50 mg. She denies SI/HI.    Observations/Objective:  Alert and oriented. Appears well, not sickly. No distress. Speaking in complete sentences.   Assessment and Plan:  See problem based charting.  Follow Up Instructions:  Call the main line to schedule your lab appointment as discussed. Make sure to fast 4 hours prior.  Continue Metformin and Glipizide for now.  We've increased the dose of your sertraline (Zoloft) to 100 mg. I sent a new prescription to the pharmacy.  Make sure you are only taking lisinopril 10 mg once daily for blood pressure.   You can try taking meclizine 12.5 mg tablets as needed for vertigo like symptoms. Please update me if your dizziness does not continue to improve.  It was a pleasure to see you today!    I discussed the assessment and treatment plan with the patient. The patient was provided an opportunity to ask questions and all were answered. The patient agreed with the plan and demonstrated an understanding of the instructions.   The patient was advised to call back or seek an in-person evaluation if the symptoms worsen or if the condition fails to improve as anticipated.     Pleas Koch, NP    Review of Systems  Eyes: Negative for visual disturbance.  Respiratory: Negative for shortness of breath.   Cardiovascular: Negative for chest pain.  Neurological: Positive for dizziness. Negative for speech difficulty, weakness and numbness.       Past Medical History:  Diagnosis  Date  . Arthritis   . Depression   . Diabetes mellitus without complication ([place])   . Diverticulitis   . Esophageal ulcer   . GAD (generalized anxiety disorder)   . GERD (gastroesophageal reflux disease)   . Hyperlipidemia   . Hypertension      Social History   Socioeconomic History  . Marital status: Married    Spouse name: Not on file  . Number of children: Not on file  . Years of education: Not on file  . Highest education level: Not on  file  Occupational History  . Not on file  Social Needs  . Financial resource strain: Not on file  . Food insecurity    Worry: Not on file    Inability: Not on file  . Transportation needs    Medical: Not on file    Non-medical: Not on file  Tobacco Use  . Smoking status: Current Every Day Smoker    Packs/day: 1.00    Types: Cigarettes  . Smokeless tobacco: Never Used  Substance and Sexual Activity  . Alcohol use: Yes    Comment: occasional   . Drug use: Yes  . Sexual activity: Not on file  Lifestyle  . Physical activity    Days per week: Not on file    Minutes per session: Not on file  . Stress: Not on file  Relationships  . Social Herbalist on phone: Not on file    Gets together: Not on file    Attends religious service: Not on file    Active member of club or organization: Not on file    Attends meetings of clubs or organizations: Not on file    Relationship status: Not on file  . Intimate partner violence    Fear of current or ex partner: Not on file    Emotionally abused: Not on file    Physically abused: Not on file    Forced sexual activity: Not on file  Other Topics Concern  . Not on file  Social History Narrative  . Not on file    Past Surgical History:  Procedure Laterality Date  . APPENDECTOMY  1994  . COLONOSCOPY WITH PROPOFOL N/A 02/03/2015   Procedure: COLONOSCOPY WITH PROPOFOL;  Surgeon: Hulen Luster, MD;  Location: [facility] ENDOSCOPY;  Service: Gastroenterology;  Laterality: N/A;  . ESOPHAGOGASTRODUODENOSCOPY (EGD) WITH PROPOFOL N/A 02/03/2015   Procedure: ESOPHAGOGASTRODUODENOSCOPY (EGD) WITH PROPOFOL;  Surgeon: Hulen Luster, MD;  Location: [facility] ENDOSCOPY;  Service: Gastroenterology;  Laterality: N/A;  . GALLBLADDER SURGERY  1989    Family History  Problem Relation Age of Onset  . Arthritis Mother   . Hyperlipidemia Mother   . Hypertension Mother   . Mental illness Mother   . Diabetes Mother   . Arthritis Father   . Hypertension Father    . Mental illness Father   . Alcohol abuse Maternal Grandmother   . Learning disabilities Maternal Grandmother   . Learning disabilities Maternal Grandfather     No Known Allergies  Current Outpatient Medications on File Prior to Visit  Medication Sig Dispense Refill  . ACCU-CHEK AVIVA PLUS test strip USE TO TEST BLOOD SUGAR EVERY MORNING AND 2HRS AFTER DINNER 100 each 1  . atorvastatin (LIPITOR) 20 MG tablet TAKE 1 TABLET BY MOUTH ONCE A DAY FOR CHOLESTEROL 90 tablet 1  . blood glucose meter kit and supplies KIT Test every morning before breakfast and 2 hours after dinner. 1 each 0  .  glipiZIDE (GLUCOTROL) 10 MG tablet TAKE 1 TABLET BY MOUTH TWICE A DAY BEFORE A MEAL FOR DIABETES 60 tablet 0  . Insulin Glargine (BASAGLAR KWIKPEN) 100 UNIT/ML SOPN Inject 0.15 mLs (15 Units total) into the skin at bedtime. 15 mL 0  . Insulin Pen Needle (PEN NEEDLES) 31G X 6 MM MISC Injection nightly with insulin pen. 100 each 5  . lisinopril (PRINIVIL,ZESTRIL) 10 MG tablet Take 1 tablet (10 mg total) by mouth daily. For blood pressure and kidney protection. 30 tablet 5  . lisinopril-hydrochlorothiazide (PRINZIDE,ZESTORETIC) 20-12.5 MG tablet TAKE 1 TABLET BY MOUTH EVERY DAY 90 tablet 1  . metFORMIN (GLUCOPHAGE) 1000 MG tablet Take 1 tablet (1,000 mg total) by mouth 2 (two) times daily with a meal. For diabetes. 60 tablet 5  . mirtazapine (REMERON) 30 MG tablet TAKE 1 TABLET BY MOUTH EVERY NIGHT AT BEDTIME FOR DEPRESSION AND SLEEP 90 tablet 1  . vitamin B-12 (CYANOCOBALAMIN) 1000 MCG tablet Take 1,000 mcg by mouth daily.     No current facility-administered medications on file prior to visit.     There were no vitals taken for this visit.   Objective:   Physical Exam  Constitutional: She is oriented to person, place, and time. She appears well-nourished.  Respiratory: Effort normal.  Neurological: She is alert and oriented to person, place, and time.  Psychiatric: She has a normal mood and affect.            Assessment & Plan:

## 2018-10-10 ENCOUNTER — Other Ambulatory Visit: Payer: Self-pay | Admitting: Primary Care

## 2018-10-10 ENCOUNTER — Other Ambulatory Visit (INDEPENDENT_AMBULATORY_CARE_PROVIDER_SITE_OTHER): Payer: Self-pay

## 2018-10-10 ENCOUNTER — Other Ambulatory Visit: Payer: Self-pay

## 2018-10-10 DIAGNOSIS — E785 Hyperlipidemia, unspecified: Secondary | ICD-10-CM

## 2018-10-10 DIAGNOSIS — E119 Type 2 diabetes mellitus without complications: Secondary | ICD-10-CM

## 2018-10-10 LAB — COMPREHENSIVE METABOLIC PANEL
ALT: 26 U/L (ref 0–35)
AST: 32 U/L (ref 0–37)
Albumin: 3.5 g/dL (ref 3.5–5.2)
Alkaline Phosphatase: 94 U/L (ref 39–117)
BUN: 8 mg/dL (ref 6–23)
CO2: 28 mEq/L (ref 19–32)
Calcium: 8.4 mg/dL (ref 8.4–10.5)
Chloride: 100 mEq/L (ref 96–112)
Creatinine, Ser: 0.72 mg/dL (ref 0.40–1.20)
GFR: 82.85 mL/min (ref >60.00)
Glucose, Bld: 291 mg/dL — ABNORMAL HIGH (ref 70–99)
Potassium: 3.8 mEq/L (ref 3.5–5.1)
Sodium: 137 mEq/L (ref 135–145)
Total Bilirubin: 0.6 mg/dL (ref 0.2–1.2)
Total Protein: 6.3 g/dL (ref 6.0–8.3)

## 2018-10-10 LAB — LIPID PANEL
Cholesterol: 162 mg/dL (ref 0–200)
HDL: 33.6 mg/dL — ABNORMAL LOW (ref >39.00)
LDL Cholesterol: 100 mg/dL — ABNORMAL HIGH (ref 0–99)
NonHDL: 128.31
Total CHOL/HDL Ratio: 5
Triglycerides: 144 mg/dL (ref 0.0–149.0)
VLDL: 28.8 mg/dL (ref 0.0–40.0)

## 2018-10-10 LAB — HEMOGLOBIN A1C: Hgb A1c MFr Bld: 15 % — ABNORMAL HIGH (ref 4.6–6.5)

## 2018-10-10 MED ORDER — NOVOLIN 70/30 RELION (70-30) 100 UNIT/ML ~~LOC~~ SUSP: 10.0000 [IU] | Freq: Two times a day (BID) | SUBCUTANEOUS | 2 refills | Status: DC

## 2018-10-11 ENCOUNTER — Telehealth: Payer: Self-pay

## 2018-10-11 ENCOUNTER — Encounter: Payer: Self-pay | Admitting: *Deleted

## 2018-10-11 MED ORDER — "INSULIN SYRINGE 31G X 5/16"" 1 ML MISC": 5 refills | Status: DC

## 2018-10-11 NOTE — Telephone Encounter (Signed)
Pt said that [facility] [address] does not have insulin. Gentry Fitz NP said pt has to have testing meter for blood sugar first before picking up insulin and syringes at [facility] [address]. Pt said she will get her daughter to bring her to [facility] on 10/12/18 to pick up glucometer and then will pick up insulin and syringes at [facility] [address]. I spoke with Tiffany at [facility] [address] and she got the insulin transferred from [address] to [facility] [address]; the insulin syringes are also at [facility] [address]. Tiffany said the insulin has the instruction on the insulin box and there is no additional charge for the insulin due to being prescription.the cost of insulin is $24.88, pt voiced understanding and very appreciative.FYI to [place].

## 2018-11-22 ENCOUNTER — Ambulatory Visit (INDEPENDENT_AMBULATORY_CARE_PROVIDER_SITE_OTHER): Payer: Self-pay | Admitting: Primary Care

## 2018-11-22 ENCOUNTER — Encounter: Payer: Self-pay | Admitting: Primary Care

## 2018-11-22 VITALS — Wt 175.0 lb

## 2018-11-22 DIAGNOSIS — Z8719 Personal history of other diseases of the digestive system: Secondary | ICD-10-CM | POA: Insufficient documentation

## 2018-11-22 DIAGNOSIS — K5792 Diverticulitis of intestine, part unspecified, without perforation or abscess without bleeding: Secondary | ICD-10-CM | POA: Insufficient documentation

## 2018-11-22 DIAGNOSIS — K5720 Diverticulitis of large intestine with perforation and abscess without bleeding: Secondary | ICD-10-CM | POA: Insufficient documentation

## 2018-11-22 HISTORY — DX: Diverticulitis of intestine, part unspecified, without perforation or abscess without bleeding: K57.92

## 2018-11-22 MED ORDER — METRONIDAZOLE 500 MG PO TABS: 500.0000 mg | ORAL_TABLET | Freq: Three times a day (TID) | ORAL | 0 refills | Status: DC

## 2018-11-22 MED ORDER — CIPROFLOXACIN HCL 500 MG PO TABS: 500.0000 mg | ORAL_TABLET | Freq: Two times a day (BID) | ORAL | 0 refills | Status: DC

## 2018-11-22 MED ORDER — TRAMADOL HCL 50 MG PO TABS: 50.0000 mg | ORAL_TABLET | Freq: Two times a day (BID) | ORAL | 0 refills | Status: AC | PRN

## 2018-11-22 NOTE — Patient Instructions (Signed)
Start ciprofloxacin antibiotics for the infection. Take 1 tablet by mouth twice daily for 10 days.  Start metronidazole antibiotics for the infection. Take 1 tablet by mouth three times daily for 10 days.  You can use the Tramadol every 12 hours as needed for pain. Use sparingly.  Go to a clear liquid diet.  Please update me if no improvement within 3-4 days. Go to the hospital if your symptoms get worse on the antibiotics.  It was a pleasure to see you today! Allie Bossier, NP-C

## 2018-11-22 NOTE — Progress Notes (Signed)
Subjective:    Patient ID: Morgan Small, female    DOB: May 29, 1960, 58 y.o.   MRN: [id number]  HPI  Virtual Visit via Video Note  I connected with [name] on 11/22/18 at  8:40 AM EDT by a video enabled telemedicine application and verified that I am speaking with the correct person using two identifiers.  Location: Patient: Home Provider: Office   I discussed the limitations of evaluation and management by telemedicine and the availability of in person appointments. The patient expressed understanding and agreed to proceed.  History of Present Illness:  Morgan Small is a 58 year old female with a history of uncontrolled diabetes, tobacco abuse, diverticulosis with acute diverticulitis (descending and sigmoid), hypertension who presents today with a chief complaint of abdominal pain.  Her pain is located to the left lower abdomen which began about three days ago. Since then her pain has progressed with intense pain last night. She denies bloody stools, bloating, diarrhea. She has noticed constipation, took a stool softener which resulted in a stool this morning. She's been taking Ibuprofen and Tylenol without much improvement. She's not checked her temperature. These symptoms feel just like her prior acute diverticulitis.  She is checking her blood sugars, fasting glucose readings are in the low 100's, 2 hours after meals about 150-180.   Observations/Objective:  Alert and oriented. Appears well, not sickly. No distress. Speaking in complete sentences.   Assessment and Plan:  Symptoms present for the last three days, worse last night.  History of diverticulosis with acute diverticulitis. Symptoms now suspicious for acute diverticulitis, especially given location of diverticulosis coupled with pain location.   Rx for Cipro 500 and Flagyl 500 mg courses sent to pharmacy. Rx for Tramadol sent to pharmacy to use PRN.  Discussed transitioning to clear liquid diet. She will  update if no improvement in several days. She appears stable for outpatient treatment. ED precautions provided.  Follow Up Instructions:  Start ciprofloxacin antibiotics for the infection. Take 1 tablet by mouth twice daily for 10 days.  Start metronidazole antibiotics for the infection. Take 1 tablet by mouth three times daily for 10 days.  You can use the Tramadol every 12 hours as needed for pain. Use sparingly.  Go to a clear liquid diet.  Please update me if no improvement within 3-4 days. Go to the hospital if your symptoms get worse on the antibiotics.  It was a pleasure to see you today! Morgan Bossier, NP-C    I discussed the assessment and treatment plan with the patient. The patient was provided an opportunity to ask questions and all were answered. The patient agreed with the plan and demonstrated an understanding of the instructions.   The patient was advised to call back or seek an in-person evaluation if the symptoms worsen or if the condition fails to improve as anticipated.     Pleas Koch, NP    Review of Systems  Constitutional: Negative for chills.  Gastrointestinal: Positive for abdominal pain and constipation. Negative for blood in stool, diarrhea, nausea and vomiting.       Past Medical History:  Diagnosis Date  . Arthritis   . Depression   . Diabetes mellitus without complication ([place])   . Diverticulitis   . Esophageal ulcer   . GAD (generalized anxiety disorder)   . GERD (gastroesophageal reflux disease)   . Hyperlipidemia   . Hypertension      Social History   Socioeconomic History  .  Marital status: Married    Spouse name: Not on file  . Number of children: Not on file  . Years of education: Not on file  . Highest education level: Not on file  Occupational History  . Not on file  Social Needs  . Financial resource strain: Not on file  . Food insecurity    Worry: Not on file    Inability: Not on file  . Transportation needs     Medical: Not on file    Non-medical: Not on file  Tobacco Use  . Smoking status: Current Every Day Smoker    Packs/day: 1.00    Types: Cigarettes  . Smokeless tobacco: Never Used  Substance and Sexual Activity  . Alcohol use: Yes    Comment: occasional   . Drug use: Yes  . Sexual activity: Not on file  Lifestyle  . Physical activity    Days per week: Not on file    Minutes per session: Not on file  . Stress: Not on file  Relationships  . Social Herbalist on phone: Not on file    Gets together: Not on file    Attends religious service: Not on file    Active member of club or organization: Not on file    Attends meetings of clubs or organizations: Not on file    Relationship status: Not on file  . Intimate partner violence    Fear of current or ex partner: Not on file    Emotionally abused: Not on file    Physically abused: Not on file    Forced sexual activity: Not on file  Other Topics Concern  . Not on file  Social History Narrative  . Not on file    Past Surgical History:  Procedure Laterality Date  . APPENDECTOMY  1994  . COLONOSCOPY WITH PROPOFOL N/A 02/03/2015   Procedure: COLONOSCOPY WITH PROPOFOL;  Surgeon: Hulen Luster, MD;  Location: [facility] ENDOSCOPY;  Service: Gastroenterology;  Laterality: N/A;  . ESOPHAGOGASTRODUODENOSCOPY (EGD) WITH PROPOFOL N/A 02/03/2015   Procedure: ESOPHAGOGASTRODUODENOSCOPY (EGD) WITH PROPOFOL;  Surgeon: Hulen Luster, MD;  Location: [facility] ENDOSCOPY;  Service: Gastroenterology;  Laterality: N/A;  . GALLBLADDER SURGERY  1989    Family History  Problem Relation Age of Onset  . Arthritis Mother   . Hyperlipidemia Mother   . Hypertension Mother   . Mental illness Mother   . Diabetes Mother   . Arthritis Father   . Hypertension Father   . Mental illness Father   . Alcohol abuse Maternal Grandmother   . Learning disabilities Maternal Grandmother   . Learning disabilities Maternal Grandfather     No Known Allergies   Current Outpatient Medications on File Prior to Visit  Medication Sig Dispense Refill  . ACCU-CHEK AVIVA PLUS test strip USE TO TEST BLOOD SUGAR EVERY MORNING AND 2HRS AFTER DINNER 100 each 1  . atorvastatin (LIPITOR) 20 MG tablet TAKE 1 TABLET BY MOUTH ONCE A DAY FOR CHOLESTEROL 90 tablet 1  . blood glucose meter kit and supplies KIT Test every morning before breakfast and 2 hours after dinner. 1 each 0  . glipiZIDE (GLUCOTROL) 10 MG tablet TAKE 1 TABLET BY MOUTH TWICE A DAY BEFORE A MEAL FOR DIABETES 60 tablet 0  . insulin NPH-regular Human (NOVOLIN 70/30 RELION) (70-30) 100 UNIT/ML injection Inject 10 Units into the skin 2 (two) times daily with a meal. For diabetes. 10 mL 2  . Insulin Pen Needle (PEN NEEDLES) 31G  X 6 MM MISC Injection nightly with insulin pen. 100 each 5  . Insulin Syringe-Needle U-100 (INSULIN SYRINGE 1CC/31GX5/16") 31G X 5/16" 1 ML MISC Use as instructed in inject insulin daily. E11.9 100 each 5  . lisinopril (ZESTRIL) 10 MG tablet Take 1 tablet (10 mg total) by mouth daily. For blood pressure and kidney protection. 90 tablet 3  . metFORMIN (GLUCOPHAGE) 1000 MG tablet Take 1 tablet (1,000 mg total) by mouth 2 (two) times daily with a meal. For diabetes. 60 tablet 5  . mirtazapine (REMERON) 30 MG tablet TAKE 1 TABLET BY MOUTH EVERY NIGHT AT BEDTIME FOR DEPRESSION AND SLEEP 90 tablet 1  . sertraline (ZOLOFT) 100 MG tablet Take 1 tablet (100 mg total) by mouth daily. For anxiety and depression. 90 tablet 1  . vitamin B-12 (CYANOCOBALAMIN) 1000 MCG tablet Take 1,000 mcg by mouth daily.     No current facility-administered medications on file prior to visit.     Wt 175 lb (79.4 kg)   BMI 29.12 kg/m    Objective:   Physical Exam  Constitutional: She is oriented to person, place, and time. She appears well-nourished.  Respiratory: Effort normal.  Neurological: She is alert and oriented to person, place, and time.  Psychiatric: She has a normal mood and affect.            Assessment & Plan:

## 2018-11-22 NOTE — Assessment & Plan Note (Signed)
Symptoms present for the last three days, worse last night.  History of diverticulosis with acute diverticulitis. Symptoms now suspicious for acute diverticulitis, especially given location of diverticulosis coupled with pain location.   Rx for Cipro 500 and Flagyl 500 mg courses sent to pharmacy. Rx for Tramadol sent to pharmacy to use PRN.  Discussed transitioning to clear liquid diet. She will update if no improvement in several days. She appears stable for outpatient treatment. ED precautions provided.

## 2018-11-22 NOTE — Telephone Encounter (Signed)
Morgan Small, will you please notify her that we need an office visit for this. Can be virtual.

## 2018-12-29 ENCOUNTER — Telehealth: Payer: Self-pay | Admitting: Primary Care

## 2018-12-29 NOTE — Telephone Encounter (Signed)
Patient stated that she is breaking out with a rash. It is all over and very itchy  She stated to the point she is itching so much she is bleeding./ I offered her an appointment this afternoon with Dr Darnell Level but she declined the appointment. Patient stated that her insurance has not taken effect yet.   Patient stated she has some Cortizone cream that she is going to use. She also mentioned that her hair is falling out by the hand fulls. And she is not sure if these both could tie in together

## 2018-12-29 NOTE — Telephone Encounter (Signed)
Patient needs evaluation in the office. Not knowing the cause for her itching makes it difficult to treat. She could try some Zyrtec nightly along with the cortisone for itching.

## 2018-12-29 NOTE — Telephone Encounter (Signed)
Spoken and notified patient of [name]'s comments. Patient verbalized understanding.  

## 2019-01-03 ENCOUNTER — Other Ambulatory Visit: Payer: Self-pay | Admitting: Primary Care

## 2019-01-03 DIAGNOSIS — E119 Type 2 diabetes mellitus without complications: Secondary | ICD-10-CM

## 2019-01-08 ENCOUNTER — Telehealth: Payer: Self-pay | Admitting: Primary Care

## 2019-01-08 NOTE — Telephone Encounter (Signed)
Patient is due for diabetes follow up. Is she able to come in within the next several weeks? I know she was working on getting her insurance set back up. Please schedule if possible.

## 2019-01-08 NOTE — Telephone Encounter (Signed)
Attempted to reach patient. No answer and voicemail is full. °

## 2019-01-10 ENCOUNTER — Encounter: Payer: Self-pay | Admitting: Primary Care

## 2019-03-28 ENCOUNTER — Other Ambulatory Visit: Payer: Self-pay | Admitting: Primary Care

## 2019-03-28 DIAGNOSIS — E119 Type 2 diabetes mellitus without complications: Secondary | ICD-10-CM

## 2019-03-28 DIAGNOSIS — E785 Hyperlipidemia, unspecified: Secondary | ICD-10-CM

## 2019-04-03 ENCOUNTER — Other Ambulatory Visit: Payer: Self-pay | Admitting: Primary Care

## 2019-04-03 DIAGNOSIS — F419 Anxiety disorder, unspecified: Secondary | ICD-10-CM

## 2019-04-03 DIAGNOSIS — F32A Depression, unspecified: Secondary | ICD-10-CM

## 2019-04-03 DIAGNOSIS — F329 Major depressive disorder, single episode, unspecified: Secondary | ICD-10-CM

## 2019-04-04 NOTE — Telephone Encounter (Signed)
Last prescribed on 10/03/2018 . Last appointment on 11/22/2018 (acute/virtual). No future appointment

## 2019-04-04 NOTE — Telephone Encounter (Signed)
Please notify patient that she is overdue for diabetes follow up and needs to be seen for further refills of diabetes medications, please schedule. Will send refills for other medications.

## 2019-04-05 NOTE — Telephone Encounter (Signed)
Message left for patient to return my call.  

## 2019-08-02 ENCOUNTER — Telehealth: Payer: Self-pay | Admitting: Primary Care

## 2019-08-02 NOTE — Telephone Encounter (Signed)
Pt is scheduled for 08/03/19 @ 11:40 per our conversation. I will cancel the 08/17/19 appt.

## 2019-08-02 NOTE — Telephone Encounter (Signed)
Noted and will evaluate.  

## 2019-08-02 NOTE — Telephone Encounter (Signed)
Pt called in and said she needs a refill on metformin and glipizide.   She also said she needed an appt for middle of May on a Friday. I scheduled appt for 5/14. Pt then says that she has been real tired and feeling weak. She also stated she can't get her blood sugar below 400. I tried to get to her to do an appt sooner maybe a virtual but she wanted me to give the message to Jae Dire first. She said she will not have a ride and she knows Jae Dire is going to want to check A1C. Also she said she still doesn't have insurance. Please advise.

## 2019-08-03 ENCOUNTER — Ambulatory Visit (INDEPENDENT_AMBULATORY_CARE_PROVIDER_SITE_OTHER): Payer: Self-pay | Admitting: Primary Care

## 2019-08-03 ENCOUNTER — Other Ambulatory Visit: Payer: Self-pay

## 2019-08-03 ENCOUNTER — Encounter: Payer: Self-pay | Admitting: Primary Care

## 2019-08-03 VITALS — BP 130/78 | HR 86 | Temp 95.9°F | Ht 65.0 in | Wt 165.0 lb

## 2019-08-03 DIAGNOSIS — F419 Anxiety disorder, unspecified: Secondary | ICD-10-CM

## 2019-08-03 DIAGNOSIS — E785 Hyperlipidemia, unspecified: Secondary | ICD-10-CM

## 2019-08-03 DIAGNOSIS — F32A Depression, unspecified: Secondary | ICD-10-CM

## 2019-08-03 DIAGNOSIS — E119 Type 2 diabetes mellitus without complications: Secondary | ICD-10-CM

## 2019-08-03 DIAGNOSIS — I10 Essential (primary) hypertension: Secondary | ICD-10-CM

## 2019-08-03 DIAGNOSIS — F329 Major depressive disorder, single episode, unspecified: Secondary | ICD-10-CM

## 2019-08-03 LAB — COMPREHENSIVE METABOLIC PANEL
ALT: 21 U/L (ref 0–35)
AST: 26 U/L (ref 0–37)
Albumin: 4.2 g/dL (ref 3.5–5.2)
Alkaline Phosphatase: 89 U/L (ref 39–117)
BUN: 26 mg/dL — ABNORMAL HIGH (ref 6–23)
CO2: 24 mEq/L (ref 19–32)
Calcium: 9.3 mg/dL (ref 8.4–10.5)
Chloride: 93 mEq/L — ABNORMAL LOW (ref 96–112)
Creatinine, Ser: 0.97 mg/dL (ref 0.40–1.20)
GFR: 58.57 mL/min — ABNORMAL LOW (ref >60.00)
Glucose, Bld: 353 mg/dL — ABNORMAL HIGH (ref 70–99)
Potassium: 3.9 mEq/L (ref 3.5–5.1)
Sodium: 131 mEq/L — ABNORMAL LOW (ref 135–145)
Total Bilirubin: 0.7 mg/dL (ref 0.2–1.2)
Total Protein: 7.8 g/dL (ref 6.0–8.3)

## 2019-08-03 LAB — POCT GLYCOSYLATED HEMOGLOBIN (HGB A1C): Hemoglobin A1C: 12.4 % — AB (ref 4.0–5.6)

## 2019-08-03 LAB — LIPID PANEL
Cholesterol: 148 mg/dL (ref 0–200)
HDL: 23.9 mg/dL — ABNORMAL LOW (ref >39.00)
NonHDL: 123.64
Total CHOL/HDL Ratio: 6
Triglycerides: 238 mg/dL — ABNORMAL HIGH (ref 0.0–149.0)
VLDL: 47.6 mg/dL — ABNORMAL HIGH (ref 0.0–40.0)

## 2019-08-03 LAB — LDL CHOLESTEROL, DIRECT: Direct LDL: 93 mg/dL

## 2019-08-03 MED ORDER — GLIPIZIDE 10 MG PO TABS: ORAL_TABLET | ORAL | 3 refills | Status: DC

## 2019-08-03 MED ORDER — METFORMIN HCL 1000 MG PO TABS: ORAL_TABLET | ORAL | 3 refills | Status: DC

## 2019-08-03 MED ORDER — MIRTAZAPINE 45 MG PO TABS: 45.0000 mg | ORAL_TABLET | Freq: Every day | ORAL | 0 refills | Status: DC

## 2019-08-03 MED ORDER — NOVOLIN 70/30 RELION (70-30) 100 UNIT/ML ~~LOC~~ SUSP: 20.0000 [IU] | Freq: Two times a day (BID) | SUBCUTANEOUS | 2 refills | Status: DC

## 2019-08-03 NOTE — Assessment & Plan Note (Signed)
Compliant to atorvastatin, repeat lipids pending. 

## 2019-08-03 NOTE — Assessment & Plan Note (Signed)
Overall doing well, except for sleep, some increase in depression given stressors over the last year.  Agree to increase mirtazapine, but told her to watch cravings and intake of sugary drinks. Continue Zoloft. she will update.

## 2019-08-03 NOTE — Assessment & Plan Note (Signed)
Stable in the office today, continue lisinopril. CMP pending.  

## 2019-08-03 NOTE — Patient Instructions (Addendum)
We've increased the dose of your Novolin 70/30 to 20 units twice daily.   Start checking your blood sugar levels.  Appropriate times to check your blood sugar levels are:  -Before any meal (breakfast, lunch, dinner) -Two hours after any meal (breakfast, lunch, dinner) -Bedtime  Record your readings and notify me if you continue to consistently run at or above 200 after three weeks.  Continue metformin and glipizide for diabetes.  We've increased your mirtazapine (Remeron) to 45 mg. Please update me.  Stop by the lab prior to leaving today. I will notify you of your results once received.   Please schedule a follow up appointment in 3 months for diabetes check.  Send me blood sugar readings in 4 weeks via My Chart.  It was a pleasure to see you today!   Diabetes Mellitus and Nutrition, Adult When you have diabetes (diabetes mellitus), it is very important to have healthy eating habits because your blood sugar (glucose) levels are greatly affected by what you eat and drink. Eating healthy foods in the appropriate amounts, at about the same times every day, can help you:  Control your blood glucose.  Lower your risk of heart disease.  Improve your blood pressure.  Reach or maintain a healthy weight. Every person with diabetes is different, and each person has different needs for a meal plan. Your health care provider may recommend that you work with a diet and nutrition specialist (dietitian) to make a meal plan that is best for you. Your meal plan may vary depending on factors such as:  The calories you need.  The medicines you take.  Your weight.  Your blood glucose, blood pressure, and cholesterol levels.  Your activity level.  Other health conditions you have, such as heart or kidney disease. How do carbohydrates affect me? Carbohydrates, also called carbs, affect your blood glucose level more than any other type of food. Eating carbs naturally raises the amount of  glucose in your blood. Carb counting is a method for keeping track of how many carbs you eat. Counting carbs is important to keep your blood glucose at a healthy level, especially if you use insulin or take certain oral diabetes medicines. It is important to know how many carbs you can safely have in each meal. This is different for every person. Your dietitian can help you calculate how many carbs you should have at each meal and for each snack. Foods that contain carbs include:  Bread, cereal, rice, pasta, and crackers.  Potatoes and corn.  Peas, beans, and lentils.  Milk and yogurt.  Fruit and juice.  Desserts, such as cakes, cookies, ice cream, and candy. How does alcohol affect me? Alcohol can cause a sudden decrease in blood glucose (hypoglycemia), especially if you use insulin or take certain oral diabetes medicines. Hypoglycemia can be a life-threatening condition. Symptoms of hypoglycemia (sleepiness, dizziness, and confusion) are similar to symptoms of having too much alcohol. If your health care provider says that alcohol is safe for you, follow these guidelines:  Limit alcohol intake to no more than 1 drink per day for nonpregnant women and 2 drinks per day for men. One drink equals 12 oz of beer, 5 oz of wine, or 1 oz of hard liquor.  Do not drink on an empty stomach.  Keep yourself hydrated with water, diet soda, or unsweetened iced tea.  Keep in mind that regular soda, juice, and other mixers may contain a lot of sugar and must be counted  as carbs. What are tips for following this plan?  Reading food labels  Start by checking the serving size on the "Nutrition Facts" label of packaged foods and drinks. The amount of calories, carbs, fats, and other nutrients listed on the label is based on one serving of the item. Many items contain more than one serving per package.  Check the total grams (g) of carbs in one serving. You can calculate the number of servings of carbs  in one serving by dividing the total carbs by 15. For example, if a food has 30 g of total carbs, it would be equal to 2 servings of carbs.  Check the number of grams (g) of saturated and trans fats in one serving. Choose foods that have low or no amount of these fats.  Check the number of milligrams (mg) of salt (sodium) in one serving. Most people should limit total sodium intake to less than 2,300 mg per day.  Always check the nutrition information of foods labeled as "low-fat" or "nonfat". These foods may be higher in added sugar or refined carbs and should be avoided.  Talk to your dietitian to identify your daily goals for nutrients listed on the label. Shopping  Avoid buying canned, premade, or processed foods. These foods tend to be high in fat, sodium, and added sugar.  Shop around the outside edge of the grocery store. This includes fresh fruits and vegetables, bulk grains, fresh meats, and fresh dairy. Cooking  Use low-heat cooking methods, such as baking, instead of high-heat cooking methods like deep frying.  Cook using healthy oils, such as olive, canola, or sunflower oil.  Avoid cooking with butter, cream, or high-fat meats. Meal planning  Eat meals and snacks regularly, preferably at the same times every day. Avoid going long periods of time without eating.  Eat foods high in fiber, such as fresh fruits, vegetables, beans, and whole grains. Talk to your dietitian about how many servings of carbs you can eat at each meal.  Eat 4-6 ounces (oz) of lean protein each day, such as lean meat, chicken, fish, eggs, or tofu. One oz of lean protein is equal to: ? 1 oz of meat, chicken, or fish. ? 1 egg. ?  cup of tofu.  Eat some foods each day that contain healthy fats, such as avocado, nuts, seeds, and fish. Lifestyle  Check your blood glucose regularly.  Exercise regularly as told by your health care provider. This may include: ? 150 minutes of moderate-intensity or  vigorous-intensity exercise each week. This could be brisk walking, biking, or water aerobics. ? Stretching and doing strength exercises, such as yoga or weightlifting, at least 2 times a week.  Take medicines as told by your health care provider.  Do not use any products that contain nicotine or tobacco, such as cigarettes and e-cigarettes. If you need help quitting, ask your health care provider.  Work with a Veterinary surgeon or diabetes educator to identify strategies to manage stress and any emotional and social challenges. Questions to ask a health care provider  Do I need to meet with a diabetes educator?  Do I need to meet with a dietitian?  What number can I call if I have questions?  When are the best times to check my blood glucose? Where to find more information:  American Diabetes Association: diabetes.org  Academy of Nutrition and Dietetics: www.eatright.[employer] of Diabetes and Digestive and Kidney Diseases (NIH): CarFlippers.tn Summary  A healthy meal plan will  help you control your blood glucose and maintain a healthy lifestyle.  Working with a diet and nutrition specialist (dietitian) can help you make a meal plan that is best for you.  Keep in mind that carbohydrates (carbs) and alcohol have immediate effects on your blood glucose levels. It is important to count carbs and to use alcohol carefully. This information is not intended to replace advice given to you by your health care provider. Make sure you discuss any questions you have with your health care provider. Document Revised: 03/04/2017 Document Reviewed: 04/26/2016 Elsevier Patient Education  2020 [employer].

## 2019-08-03 NOTE — Assessment & Plan Note (Signed)
A1C of 12.4 today, above goal, has also been injecting insulin without check glucose levels.  Increase Novolin 70/30 to 20 units BID.  Continue Metformin and Glipizide. Managed on statin and ACE. Foot exam today. She will set up an eye exam when she can afford to.  Follow up in 3 months, she will send glucose readings in 4 weeks.

## 2019-08-03 NOTE — Progress Notes (Signed)
Subjective:    Patient ID: Morgan Small, female    DOB: 1961-03-14, 59 y.o.   MRN: [id number]  HPI  This visit occurred during the SARS-CoV-2 public health emergency.  Safety protocols were in place, including screening questions prior to the visit, additional usage of staff PPE, and extensive cleaning of exam room while observing appropriate contact time as indicated for disinfecting solutions.   Morgan Small is a 59 year old female with a history of uncontrolled diabetes, hypertension, anxiety and depression who presents today for follow up.  1) Anxiety and Depression: Currently managed on Zoloft 100 mg, Remeron 30 mg. Overall she feels well controlled on her regimen, but would like to increase the dose of her Remeron as she's sleeping 2-4 hours nightly before she wakes. She has been through a lot of stress over the last year. Remeron has helped a lot in the past. She denies SI/HI.   2) Type 2 Diabetes:   Current medications include: Glipizide 10 mg BID, Metformin 1000 mg BID, Novolin 70/30 10 units BID. She was out of her Glipizide and metformin three weeks ago for a total of three weeks, resumed both about one week ago.   She is checking her blood glucose 0 times daily over the last 6 months, and three times daily over the past week.   Am fasting: 400-500 Before lunch: 400's Before dinner: 400-500's  She admits to drinking sugary drinks such as soda and sweet tea, eating potatoes and fast food for quite some time.    Last A1C: 15 in July 2020, 12.4 today. Last Eye Exam:  Last Foot Exam:  Pneumonia Vaccination: ACE/ARB: Lisinopril  Statin: Lipitor   3) Hyperlipidemia: Currently managed on atorvastatin 20 daily for which she is complaint. Last lipid panel was 100 in July 2020.   BP Readings from Last 3 Encounters:  08/03/19 130/78  02/15/18 124/74  05/19/17 118/74     Review of Systems  Constitutional: Positive for fatigue.  Eyes: Negative for visual disturbance.    Respiratory: Negative for shortness of breath.   Cardiovascular: Negative for chest pain.  Neurological: Positive for weakness. Negative for numbness.  Psychiatric/Behavioral: Positive for sleep disturbance.       Past Medical History:  Diagnosis Date  . Arthritis   . Depression   . Diabetes mellitus without complication ([place])   . Diverticulitis   . Esophageal ulcer   . GAD (generalized anxiety disorder)   . GERD (gastroesophageal reflux disease)   . Hyperlipidemia   . Hypertension      Social History   Socioeconomic History  . Marital status: Married    Spouse name: Not on file  . Number of children: Not on file  . Years of education: Not on file  . Highest education level: Not on file  Occupational History  . Not on file  Tobacco Use  . Smoking status: Current Every Day Smoker    Packs/day: 1.00    Types: Cigarettes  . Smokeless tobacco: Never Used  Substance and Sexual Activity  . Alcohol use: Yes    Comment: occasional   . Drug use: Yes  . Sexual activity: Not on file  Other Topics Concern  . Not on file  Social History Narrative  . Not on file   Social Determinants of Health   Financial Resource Strain:   . Difficulty of Paying Living Expenses:   Food Insecurity:   . Worried About Charity fundraiser in the Last Year:   .  Ran Out of Food in the Last Year:   Transportation Needs:   . Film/video editor (Medical):   Marland Kitchen Lack of Transportation (Non-Medical):   Physical Activity:   . Days of Exercise per Week:   . Minutes of Exercise per Session:   Stress:   . Feeling of Stress :   Social Connections:   . Frequency of Communication with Friends and Family:   . Frequency of Social Gatherings with Friends and Family:   . Attends Religious Services:   . Active Member of Clubs or Organizations:   . Attends Archivist Meetings:   Marland Kitchen Marital Status:   Intimate Partner Violence:   . Fear of Current or Ex-Partner:   . Emotionally Abused:   Marland Kitchen  Physically Abused:   . Sexually Abused:     Past Surgical History:  Procedure Laterality Date  . APPENDECTOMY  1994  . COLONOSCOPY WITH PROPOFOL N/A 02/03/2015   Procedure: COLONOSCOPY WITH PROPOFOL;  Surgeon: Hulen Luster, MD;  Location[facility] ENDOSCOPY;  Service: Gastroenterology;  Laterality: N/A;  . ESOPHAGOGASTRODUODENOSCOPY (EGD) WITH PROPOFOL N/A 02/03/2015   Procedure: ESOPHAGOGASTRODUODENOSCOPY (EGD) WITH PROPOFOL;  Surgeon: Hulen Luster, MD;  Location: [facility] ENDOSCOPY;  Service: Gastroenterology;  Laterality: N/A;  . GALLBLADDER SURGERY  1989    Family History  Problem Relation Age of Onset  . Arthritis Mother   . Hyperlipidemia Mother   . Hypertension Mother   . Mental illness Mother   . Diabetes Mother   . Arthritis Father   . Hypertension Father   . Mental illness Father   . Alcohol abuse Maternal Grandmother   . Learning disabilities Maternal Grandmother   . Learning disabilities Maternal Grandfather     No Known Allergies  Current Outpatient Medications on File Prior to Visit  Medication Sig Dispense Refill  . ACCU-CHEK AVIVA PLUS test strip USE TO TEST BLOOD SUGAR EVERY MORNING AND 2HRS AFTER DINNER 100 each 1  . atorvastatin (LIPITOR) 20 MG tablet TAKE 1 TABLET BY MOUTH ONCE DAILY FOR CHOLESTEROL 90 tablet 0  . blood glucose meter kit and supplies KIT Test every morning before breakfast and 2 hours after dinner. 1 each 0  . Insulin Pen Needle (PEN NEEDLES) 31G X 6 MM MISC Injection nightly with insulin pen. 100 each 5  . Insulin Syringe-Needle U-100 (INSULIN SYRINGE 1CC/31GX5/16") 31G X 5/16" 1 ML MISC Use as instructed in inject insulin daily. E11.9 100 each 5  . lisinopril (ZESTRIL) 10 MG tablet Take 1 tablet (10 mg total) by mouth daily. For blood pressure and kidney protection. 90 tablet 3  . sertraline (ZOLOFT) 100 MG tablet TAKE 1 TABLET BY MOUTH DAILY FOR ANXIETYAND DEPRESSION 90 tablet 1  . vitamin B-12 (CYANOCOBALAMIN) 1000 MCG tablet Take 1,000 mcg by mouth  daily.     No current facility-administered medications on file prior to visit.    BP 130/78   Pulse 86   Temp (!) 95.9 F (35.5 C) (Temporal)   Ht '5\' 5"'$  (1.651 m)   Wt 165 lb (74.8 kg)   SpO2 98%   BMI 27.46 kg/m    Objective:   Physical Exam  Constitutional: She appears well-nourished.  Cardiovascular: Normal rate and regular rhythm.  Respiratory: Effort normal and breath sounds normal.  Musculoskeletal:     Cervical back: Neck supple.  Skin: Skin is warm and dry.  Psychiatric: She has a normal mood and affect.           Assessment &  Plan:

## 2019-08-17 ENCOUNTER — Ambulatory Visit: Payer: Self-pay | Admitting: Primary Care

## 2019-09-25 ENCOUNTER — Other Ambulatory Visit: Payer: Self-pay | Admitting: Primary Care

## 2019-09-25 DIAGNOSIS — E785 Hyperlipidemia, unspecified: Secondary | ICD-10-CM

## 2019-09-25 DIAGNOSIS — F419 Anxiety disorder, unspecified: Secondary | ICD-10-CM

## 2019-09-25 DIAGNOSIS — F32A Depression, unspecified: Secondary | ICD-10-CM

## 2019-11-02 ENCOUNTER — Ambulatory Visit: Payer: Self-pay | Admitting: Primary Care

## 2019-11-02 ENCOUNTER — Encounter: Payer: Self-pay | Admitting: Primary Care

## 2019-11-02 ENCOUNTER — Other Ambulatory Visit: Payer: Self-pay

## 2019-11-02 VITALS — BP 122/70 | HR 94 | Temp 96.6°F | Ht 65.0 in | Wt 172.5 lb

## 2019-11-02 DIAGNOSIS — E119 Type 2 diabetes mellitus without complications: Secondary | ICD-10-CM

## 2019-11-02 LAB — BASIC METABOLIC PANEL
BUN: 11 mg/dL (ref 6–23)
CO2: 30 mEq/L (ref 19–32)
Calcium: 9.7 mg/dL (ref 8.4–10.5)
Chloride: 99 mEq/L (ref 96–112)
Creatinine, Ser: 0.83 mg/dL (ref 0.40–1.20)
GFR: 70.06 mL/min (ref >60.00)
Glucose, Bld: 116 mg/dL — ABNORMAL HIGH (ref 70–99)
Potassium: 4 mEq/L (ref 3.5–5.1)
Sodium: 137 mEq/L (ref 135–145)

## 2019-11-02 LAB — POCT GLYCOSYLATED HEMOGLOBIN (HGB A1C): Hemoglobin A1C: 7.7 % — AB (ref 4.0–5.6)

## 2019-11-02 NOTE — Progress Notes (Signed)
Subjective:    Patient ID: Morgan Small, female    DOB: Apr 19, 1960, 59 y.o.   MRN: [id number]  HPI  This visit occurred during the SARS-CoV-2 public health emergency.  Safety protocols were in place, including screening questions prior to the visit, additional usage of staff PPE, and extensive cleaning of exam room while observing appropriate contact time as indicated for disinfecting solutions.   Morgan Small is a 59 year old female with a history of hypertension, type 2 diabetes, hyperlipidemia who presents today for follow up of diabetes.  Current medications include: Glipizide 10 mg BID, Metformin 1000 mg BID, Novolin 70/30 20 units BID.  She is checking her blood glucose 2 times daily and is getting readings of:  AM fasting: low 100's 2 hours after breakfast: 130's 2 hours dinner: mid 100's  She's had two low readings within the last 3 months, 48 and 58. She does not eat much for breakfast.   Last A1C: 12.5 in April 2021, 7.7 today Last Eye Exam: Due Last Foot Exam: UTD Pneumonia Vaccination: Completed in 2018 ACE/ARB: Lisinopril  Statin: atorvastatin   BP Readings from Last 3 Encounters:  11/02/19 122/70  08/03/19 130/78  02/15/18 124/74        Review of Systems  Constitutional: Negative for fatigue.  Eyes: Negative for visual disturbance.  Respiratory: Negative for shortness of breath.   Cardiovascular: Negative for chest pain.  Neurological: Negative for dizziness and headaches.       Past Medical History:  Diagnosis Date  . Acute diverticulitis 11/22/2018  . Arthritis   . Depression   . Diabetes mellitus without complication ([name])   . Diverticulitis   . Esophageal ulcer   . GAD (generalized anxiety disorder)   . GERD (gastroesophageal reflux disease)   . Hyperlipidemia   . Hypertension      Social History   Socioeconomic History  . Marital status: Married    Spouse name: Not on file  . Number of children: Not on file  . Years of education: Not  on file  . Highest education level: Not on file  Occupational History  . Not on file  Tobacco Use  . Smoking status: Current Every Day Smoker    Packs/day: 1.00    Types: Cigarettes  . Smokeless tobacco: Never Used  Substance and Sexual Activity  . Alcohol use: Yes    Comment: occasional   . Drug use: Yes  . Sexual activity: Not on file  Other Topics Concern  . Not on file  Social History Narrative  . Not on file   Social Determinants of Health   Financial Resource Strain:   . Difficulty of Paying Living Expenses:   Food Insecurity:   . Worried About Charity fundraiser in the Last Year:   . Arboriculturist in the Last Year:   Transportation Needs:   . Film/video editor (Medical):   Marland Kitchen Lack of Transportation (Non-Medical):   Physical Activity:   . Days of Exercise per Week:   . Minutes of Exercise per Session:   Stress:   . Feeling of Stress :   Social Connections:   . Frequency of Communication with Friends and Family:   . Frequency of Social Gatherings with Friends and Family:   . Attends Religious Services:   . Active Member of Clubs or Organizations:   . Attends Archivist Meetings:   Marland Kitchen Marital Status:   Intimate Partner Violence:   . Fear  of Current or Ex-Partner:   . Emotionally Abused:   Marland Kitchen Physically Abused:   . Sexually Abused:     Past Surgical History:  Procedure Laterality Date  . APPENDECTOMY  1994  . COLONOSCOPY WITH PROPOFOL N/A 02/03/2015   Procedure: COLONOSCOPY WITH PROPOFOL;  Surgeon: Hulen Luster, MD;  Location: [facility] ENDOSCOPY;  Service: Gastroenterology;  Laterality: N/A;  . ESOPHAGOGASTRODUODENOSCOPY (EGD) WITH PROPOFOL N/A 02/03/2015   Procedure: ESOPHAGOGASTRODUODENOSCOPY (EGD) WITH PROPOFOL;  Surgeon: Hulen Luster, MD;  Location: [facility] ENDOSCOPY;  Service: Gastroenterology;  Laterality: N/A;  . GALLBLADDER SURGERY  1989    Family History  Problem Relation Age of Onset  . Arthritis Mother   . Hyperlipidemia Mother   .  Hypertension Mother   . Mental illness Mother   . Diabetes Mother   . Arthritis Father   . Hypertension Father   . Mental illness Father   . Alcohol abuse Maternal Grandmother   . Learning disabilities Maternal Grandmother   . Learning disabilities Maternal Grandfather     No Known Allergies  Current Outpatient Medications on File Prior to Visit  Medication Sig Dispense Refill  . ACCU-CHEK AVIVA PLUS test strip USE TO TEST BLOOD SUGAR EVERY MORNING AND 2HRS AFTER DINNER 100 each 1  . atorvastatin (LIPITOR) 20 MG tablet TAKE 1 TABLET BY MOUTH ONCE DAILY FOR CHOLESTEROL 90 tablet 1  . blood glucose meter kit and supplies KIT Test every morning before breakfast and 2 hours after dinner. 1 each 0  . glipiZIDE (GLUCOTROL) 10 MG tablet TAKE 1 TABLET BY MOUTH TWICE A DAY BEFORE A MEAL FOR DIABETES 180 tablet 3  . insulin NPH-regular Human (NOVOLIN 70/30 RELION) (70-30) 100 UNIT/ML injection Inject 20 Units into the skin 2 (two) times daily with a meal. For diabetes. 10 mL 2  . Insulin Pen Needle (PEN NEEDLES) 31G X 6 MM MISC Injection nightly with insulin pen. 100 each 5  . Insulin Syringe-Needle U-100 (INSULIN SYRINGE 1CC/31GX5/16") 31G X 5/16" 1 ML MISC Use as instructed in inject insulin daily. E11.9 100 each 5  . lisinopril (ZESTRIL) 10 MG tablet Take 1 tablet (10 mg total) by mouth daily. For blood pressure and kidney protection. 90 tablet 3  . metFORMIN (GLUCOPHAGE) 1000 MG tablet TAKE 1 TABLET BY MOUTH TWICE A DAY WITH A MEAL FOR DIABETES 180 tablet 3  . mirtazapine (REMERON) 45 MG tablet Take 1 tablet (45 mg total) by mouth at bedtime. For sleep and depression. 90 tablet 0  . sertraline (ZOLOFT) 100 MG tablet TAKE 1 TABLET BY MOUTH DAILY FOR ANXIETYAND DEPRESSION 90 tablet 1  . vitamin B-12 (CYANOCOBALAMIN) 1000 MCG tablet Take 1,000 mcg by mouth daily.     No current facility-administered medications on file prior to visit.    BP 122/70   Pulse 94   Temp (!) 96.6 F (35.9 C)  (Temporal)   Ht _0  (1.651 m)   Wt 172 lb 8 oz (78.2 kg)   SpO2 96%   BMI 28.71 kg/m    Objective:   Physical Exam Cardiovascular:     Rate and Rhythm: Normal rate and regular rhythm.  Pulmonary:     Effort: Pulmonary effort is normal.     Breath sounds: Normal breath sounds.  Musculoskeletal:     Cervical back: Neck supple.  Skin:    General: Skin is warm and dry.            Assessment & Plan:

## 2019-11-02 NOTE — Assessment & Plan Note (Addendum)
Significant improvement today with A1C of 7.7 down from 12!! Commended her on this!  Continue Novolin 70/30 at 20 units BID. Continue Glipizide and Metformin.  Discussed to work on eating when taking insulin, or adjust insulin times. Overall she's had very infrequent low readings.  Repeat BMP pending.  Follow up in 6 months.

## 2019-11-02 NOTE — Patient Instructions (Signed)
Stop by the lab prior to leaving today. I will notify you of your results once received.   Continue the Novolin 70/30 insulin at 20 units twice daily. Continue Glipizide and Metformin.  Continue to work on a healthy diet. Ensure you are consuming 64 ounces of water daily.  Start exercising. You should be getting 150 minutes of moderate intensity exercise weekly.  Please schedule a follow up appointment in 6 months for diabetes check.   It was a pleasure to see you today!

## 2019-11-16 ENCOUNTER — Other Ambulatory Visit: Payer: Self-pay | Admitting: Primary Care

## 2019-11-16 DIAGNOSIS — F419 Anxiety disorder, unspecified: Secondary | ICD-10-CM

## 2019-11-16 DIAGNOSIS — F329 Major depressive disorder, single episode, unspecified: Secondary | ICD-10-CM

## 2019-11-16 DIAGNOSIS — F32A Depression, unspecified: Secondary | ICD-10-CM

## 2019-12-17 ENCOUNTER — Other Ambulatory Visit: Payer: Self-pay

## 2019-12-17 DIAGNOSIS — F419 Anxiety disorder, unspecified: Secondary | ICD-10-CM

## 2019-12-17 DIAGNOSIS — F32A Depression, unspecified: Secondary | ICD-10-CM

## 2019-12-17 MED ORDER — MIRTAZAPINE 45 MG PO TABS: 45.0000 mg | ORAL_TABLET | Freq: Every day | ORAL | 4 refills | Status: DC

## 2019-12-27 ENCOUNTER — Other Ambulatory Visit: Payer: Self-pay | Admitting: Primary Care

## 2019-12-27 DIAGNOSIS — I10 Essential (primary) hypertension: Secondary | ICD-10-CM

## 2020-01-30 ENCOUNTER — Encounter: Payer: Self-pay | Admitting: Family Medicine

## 2020-01-30 ENCOUNTER — Telehealth (INDEPENDENT_AMBULATORY_CARE_PROVIDER_SITE_OTHER): Payer: Self-pay | Admitting: Family Medicine

## 2020-01-30 VITALS — Ht 65.0 in

## 2020-01-30 DIAGNOSIS — K5792 Diverticulitis of intestine, part unspecified, without perforation or abscess without bleeding: Secondary | ICD-10-CM

## 2020-01-30 MED ORDER — AMOXICILLIN-POT CLAVULANATE 875-125 MG PO TABS: 1.0000 | ORAL_TABLET | Freq: Two times a day (BID) | ORAL | 0 refills | Status: DC

## 2020-01-30 MED ORDER — TRAMADOL HCL 50 MG PO TABS: 50.0000 mg | ORAL_TABLET | Freq: Three times a day (TID) | ORAL | 0 refills | Status: AC | PRN

## 2020-01-30 NOTE — Progress Notes (Signed)
Virtual Visit via Video Note  I connected with Morgan Small on 01/30/20 at  2:00 PM EDT by a video enabled telemedicine application and verified that I am speaking with the correct person using two identifiers.  Location: Patient: In her home Provider: [facility] Persons participating in virtual visit: Patient, provider   I discussed the limitations of evaluation and management by telemedicine and the availability of in person appointments. The patient expressed understanding and agreed to proceed.  History of Present Illness: Chief Complaint  Patient presents with  . Flank Pain    h/o diverticulitis...pain for 3 days   This is a 59 year old female who presents today for virtual visit for above chief complaint.  She reports 3 days of left lower quadrant pain.  Some intermittent constipation with normal bowel movement today.  She feels like her pain is getting worse.  She denies fever, nausea, vomiting.  She reports that she feels tired.  History of diabetes and she reports blood sugars have been well controlled during illness.  She has a history of diverticulitis with last known episode 11/2018.   Observations/Objective: Patient is alert and answers questions appropriately.  Visible skin is unremarkable.  Respirations are even and unlabored without increased work of breathing with conversation.  No audible wheeze or witnessed cough.  Mood and affect are appropriate. Ht 5\' 5"  (1.651 m)   BMI 28.71 kg/m  Wt Readings from Last 3 Encounters:  11/02/19 172 lb 8 oz (78.2 kg)  08/03/19 165 lb (74.8 kg)  11/22/18 175 lb (79.4 kg)    Assessment and Plan: 1. Diverticulitis -Discussed treatment, follow-up precautions if no improvement with antibiotic, worsening pain, nausea/vomiting - amoxicillin-clavulanate (AUGMENTIN) 875-125 MG tablet; Take 1 tablet by mouth 2 (two) times daily.  Dispense: 14 tablet; Refill: 0 - traMADol (ULTRAM) 50 MG tablet; Take 1 tablet (50 mg total) by mouth  every 8 (eight) hours as needed for up to 3 days.  Dispense: 10 tablet; Refill: 0   11/24/18, FNP-BC  [facility], [facility]  01/30/2020 2:11 PM   Follow Up Instructions:    I discussed the assessment and treatment plan with the patient. The patient was provided an opportunity to ask questions and all were answered. The patient agreed with the plan and demonstrated an understanding of the instructions.   The patient was advised to call back or seek an in-person evaluation if the symptoms worsen or if the condition fails to improve as anticipated.    02/01/2020, FNP

## 2020-03-25 ENCOUNTER — Other Ambulatory Visit: Payer: Self-pay | Admitting: Primary Care

## 2020-03-25 DIAGNOSIS — E785 Hyperlipidemia, unspecified: Secondary | ICD-10-CM

## 2020-05-05 ENCOUNTER — Encounter: Payer: Self-pay | Admitting: Primary Care

## 2020-05-05 ENCOUNTER — Other Ambulatory Visit: Payer: Self-pay

## 2020-05-05 ENCOUNTER — Ambulatory Visit: Payer: Self-pay | Admitting: Primary Care

## 2020-05-05 VITALS — BP 124/68 | HR 86 | Temp 97.8°F | Ht 65.0 in | Wt 179.6 lb

## 2020-05-05 DIAGNOSIS — K5792 Diverticulitis of intestine, part unspecified, without perforation or abscess without bleeding: Secondary | ICD-10-CM

## 2020-05-05 DIAGNOSIS — E785 Hyperlipidemia, unspecified: Secondary | ICD-10-CM

## 2020-05-05 DIAGNOSIS — F419 Anxiety disorder, unspecified: Secondary | ICD-10-CM

## 2020-05-05 DIAGNOSIS — F32A Depression, unspecified: Secondary | ICD-10-CM

## 2020-05-05 DIAGNOSIS — I10 Essential (primary) hypertension: Secondary | ICD-10-CM

## 2020-05-05 DIAGNOSIS — E119 Type 2 diabetes mellitus without complications: Secondary | ICD-10-CM

## 2020-05-05 LAB — COMPREHENSIVE METABOLIC PANEL
ALT: 17 U/L (ref 0–35)
AST: 14 U/L (ref 0–37)
Albumin: 3.6 g/dL (ref 3.5–5.2)
Alkaline Phosphatase: 118 U/L — ABNORMAL HIGH (ref 39–117)
BUN: 12 mg/dL (ref 6–23)
CO2: 27 mEq/L (ref 19–32)
Calcium: 9.2 mg/dL (ref 8.4–10.5)
Chloride: 101 mEq/L (ref 96–112)
Creatinine, Ser: 0.87 mg/dL (ref 0.40–1.20)
GFR: 72.23 mL/min (ref >60.00)
Glucose, Bld: 110 mg/dL — ABNORMAL HIGH (ref 70–99)
Potassium: 4.1 mEq/L (ref 3.5–5.1)
Sodium: 136 mEq/L (ref 135–145)
Total Bilirubin: 0.3 mg/dL (ref 0.2–1.2)
Total Protein: 7.7 g/dL (ref 6.0–8.3)

## 2020-05-05 LAB — CBC WITH DIFFERENTIAL/PLATELET
Basophils Absolute: 0.1 10*3/uL (ref 0.0–0.1)
Basophils Relative: 0.7 % (ref 0.0–3.0)
Eosinophils Absolute: 0.1 10*3/uL (ref 0.0–0.7)
Eosinophils Relative: 1.4 % (ref 0.0–5.0)
HCT: 31.2 % — ABNORMAL LOW (ref 36.0–46.0)
Hemoglobin: 10.3 g/dL — ABNORMAL LOW (ref 12.0–15.0)
Lymphocytes Relative: 17.6 % (ref 12.0–46.0)
Lymphs Abs: 1.8 10*3/uL (ref 0.7–4.0)
MCHC: 32.9 g/dL (ref 30.0–36.0)
MCV: 75.8 fl — ABNORMAL LOW (ref 78.0–100.0)
Monocytes Absolute: 0.7 10*3/uL (ref 0.1–1.0)
Monocytes Relative: 6.9 % (ref 3.0–12.0)
Neutro Abs: 7.4 10*3/uL (ref 1.4–7.7)
Neutrophils Relative %: 73.4 % (ref 43.0–77.0)
Platelets: 290 10*3/uL (ref 150.0–400.0)
RBC: 4.11 Mil/uL (ref 3.87–5.11)
RDW: 15.7 % — ABNORMAL HIGH (ref 11.5–15.5)
WBC: 10.1 10*3/uL (ref 4.0–10.5)

## 2020-05-05 LAB — HEMOGLOBIN A1C: Hgb A1c MFr Bld: 7.1 % — ABNORMAL HIGH (ref 4.6–6.5)

## 2020-05-05 LAB — LIPID PANEL
Cholesterol: 114 mg/dL (ref 0–200)
HDL: 23.6 mg/dL — ABNORMAL LOW (ref >39.00)
LDL Cholesterol: 65 mg/dL (ref 0–99)
NonHDL: 89.97
Total CHOL/HDL Ratio: 5
Triglycerides: 126 mg/dL (ref 0.0–149.0)
VLDL: 25.2 mg/dL (ref 0.0–40.0)

## 2020-05-05 MED ORDER — METRONIDAZOLE 500 MG PO TABS: 500.0000 mg | ORAL_TABLET | Freq: Three times a day (TID) | ORAL | 0 refills | Status: DC

## 2020-05-05 MED ORDER — VALSARTAN 40 MG PO TABS: 40.0000 mg | ORAL_TABLET | Freq: Every day | ORAL | 3 refills | Status: DC

## 2020-05-05 MED ORDER — CIPROFLOXACIN HCL 500 MG PO TABS: 500.0000 mg | ORAL_TABLET | Freq: Two times a day (BID) | ORAL | 0 refills | Status: DC

## 2020-05-05 NOTE — Progress Notes (Signed)
Subjective:    Patient ID: Morgan Small, female    DOB: December 24, 1960, 60 y.o.   MRN: [id number]  HPI  This visit occurred during the SARS-CoV-2 public health emergency.  Safety protocols were in place, including screening questions prior to the visit, additional usage of staff PPE, and extensive cleaning of exam room while observing appropriate contact time as indicated for disinfecting solutions.   Morgan Small is a 60 year old female with a history of type 2 diabetes, hypertension, anxiety and depression, hyperlipidemia who presents today for follow up.  1) Type 2 Diabetes:   Current medications include: Glipizide 10 mg BID, Metformin 1000 mg BID, Novolin 70/30 20 units twice daily.   She is checking her blood glucose 1 times daily and is getting readings of:  AM fasting: low 100's.   She does feel jittery and weak intermittently, has been woken from sleep, has seen readings in the 40's and 60's. She would like to stop taking Glipizide.   Last A1C: 7.7 in July 2021 Last Eye Exam: Due Last Foot Exam: UTD Pneumonia Vaccination: UTD ACE/ARB: Lisinopril  Statin: Lipitor   2) Essential Hypertension: Currently managed on lisinopril 10 mg. She denies chest pain, dizziness. She did wake up with tongue swelling around Thanksgiving after an episode of nausea and vomiting, thinks she may have eaten something that didn't agree with her. The tongue swelling gradually decreased in size over a few hours.   BP Readings from Last 3 Encounters:  05/05/20 124/68  11/02/19 122/70  08/03/19 130/78    3) Anxiety and Depression: Currently managed on Zoloft 100 mg, mirtazapine 45 mg HS. Overall feels well managed on this regimen.   4) Abdominal Pain: History of Diverticulitis which she typically feels in the left lower abdomen. Symptoms of LLQ abdominal pain that began about one week ago, feeling worse now. Evaluated in October 2021, video visit, with [facility], treated with Augmentin course,  improved but didn't feel as though symptoms had completed resolved.   She denies fevers, diarrhea, blood stools. She underwent colonoscopy in 2016 which revealed descending and sigmoid diverticulosis. Her pain today feels exactly like her diverticulitis flares.    Review of Systems  Eyes: Negative for visual disturbance.  Respiratory: Negative for shortness of breath.   Cardiovascular: Negative for chest pain.  Gastrointestinal: Positive for abdominal pain. Negative for blood in stool, diarrhea and nausea.  Neurological: Negative for dizziness.  Psychiatric/Behavioral: Negative for sleep disturbance. The patient is not nervous/anxious.        Past Medical History:  Diagnosis Date  . Acute diverticulitis 11/22/2018  . Arthritis   . Depression   . Diabetes mellitus without complication ([place])   . Diverticulitis   . Esophageal ulcer   . GAD (generalized anxiety disorder)   . GERD (gastroesophageal reflux disease)   . Hyperlipidemia   . Hypertension      Social History   Socioeconomic History  . Marital status: Married    Spouse name: Not on file  . Number of children: Not on file  . Years of education: Not on file  . Highest education level: Not on file  Occupational History  . Not on file  Tobacco Use  . Smoking status: Current Every Day Smoker    Packs/day: 1.00    Types: Cigarettes  . Smokeless tobacco: Never Used  Substance and Sexual Activity  . Alcohol use: Yes    Comment: occasional   . Drug use: Yes  . Sexual activity:  Not on file  Other Topics Concern  . Not on file  Social History Narrative  . Not on file   Social Determinants of Health   Financial Resource Strain: Not on file  Food Insecurity: Not on file  Transportation Needs: Not on file  Physical Activity: Not on file  Stress: Not on file  Social Connections: Not on file  Intimate Partner Violence: Not on file    Past Surgical History:  Procedure Laterality Date  . APPENDECTOMY  1994  .  COLONOSCOPY WITH PROPOFOL N/A 02/03/2015   Procedure: COLONOSCOPY WITH PROPOFOL;  Surgeon: Hulen Luster, MD;  Location: [facility] ENDOSCOPY;  Service: Gastroenterology;  Laterality: N/A;  . ESOPHAGOGASTRODUODENOSCOPY (EGD) WITH PROPOFOL N/A 02/03/2015   Procedure: ESOPHAGOGASTRODUODENOSCOPY (EGD) WITH PROPOFOL;  Surgeon: Hulen Luster, MD;  Location[facility] ENDOSCOPY;  Service: Gastroenterology;  Laterality: N/A;  . GALLBLADDER SURGERY  1989    Family History  Problem Relation Age of Onset  . Arthritis Mother   . Hyperlipidemia Mother   . Hypertension Mother   . Mental illness Mother   . Diabetes Mother   . Arthritis Father   . Hypertension Father   . Mental illness Father   . Alcohol abuse Maternal Grandmother   . Learning disabilities Maternal Grandmother   . Learning disabilities Maternal Grandfather     No Known Allergies  Current Outpatient Medications on File Prior to Visit  Medication Sig Dispense Refill  . ACCU-CHEK AVIVA PLUS test strip USE TO TEST BLOOD SUGAR EVERY MORNING AND 2HRS AFTER DINNER 100 each 1  . amoxicillin-clavulanate (AUGMENTIN) 875-125 MG tablet Take 1 tablet by mouth 2 (two) times daily. 14 tablet 0  . atorvastatin (LIPITOR) 20 MG tablet TAKE 1 TABLET BY MOUTH ONCE DAILY FOR CHOLESTEROL 90 tablet 0  . blood glucose meter kit and supplies KIT Test every morning before breakfast and 2 hours after dinner. 1 each 0  . glipiZIDE (GLUCOTROL) 10 MG tablet TAKE 1 TABLET BY MOUTH TWICE A DAY BEFORE A MEAL FOR DIABETES 180 tablet 3  . insulin NPH-regular Human (NOVOLIN 70/30 RELION) (70-30) 100 UNIT/ML injection Inject 20 Units into the skin 2 (two) times daily with a meal. For diabetes. 10 mL 2  . Insulin Syringe-Needle U-100 (INSULIN SYRINGE 1CC/31GX5/16") 31G X 5/16" 1 ML MISC Use as instructed in inject insulin daily. E11.9 100 each 5  . metFORMIN (GLUCOPHAGE) 1000 MG tablet TAKE 1 TABLET BY MOUTH TWICE A DAY WITH A MEAL FOR DIABETES 180 tablet 3  . mirtazapine (REMERON) 45  MG tablet Take 1 tablet (45 mg total) by mouth at bedtime. 30 tablet 4  . sertraline (ZOLOFT) 100 MG tablet TAKE 1 TABLET BY MOUTH DAILY FOR ANXIETYAND DEPRESSION 90 tablet 1  . vitamin B-12 (CYANOCOBALAMIN) 1000 MCG tablet Take 1,000 mcg by mouth daily.    . Insulin Pen Needle (PEN NEEDLES) 31G X 6 MM MISC Injection nightly with insulin pen. (Patient not taking: No sig reported) 100 each 5   No current facility-administered medications on file prior to visit.    BP 124/68   Pulse 86   Temp 97.8 F (36.6 C) (Oral)   Ht '5\' 5"'  (1.651 m)   Wt 179 lb 9.6 oz (81.5 kg)   SpO2 98%   BMI 29.89 kg/m    Objective:   Physical Exam Constitutional:      Appearance: She is well-nourished.  Cardiovascular:     Rate and Rhythm: Normal rate and regular rhythm.  Pulmonary:  Effort: Pulmonary effort is normal.     Breath sounds: Normal breath sounds.  Abdominal:     General: Bowel sounds are normal.     Palpations: Abdomen is soft.     Tenderness: There is abdominal tenderness in the left lower quadrant.  Musculoskeletal:     Cervical back: Neck supple.  Skin:    General: Skin is warm and dry.  Psychiatric:        Mood and Affect: Mood and affect normal.            Assessment & Plan:

## 2020-05-05 NOTE — Patient Instructions (Addendum)
Stop by the lab prior to leaving today. I will notify you of your results once received.   Start ciprofloxacin antibiotics for the infection. Take 1 tablet by mouth twice daily for 10 days.  Start metronidazole 500 mg tablets for the infection. Take 1 tablet by mouth three times daily for 10 days.  Stop lisinopril for blood pressure. Start valsartan 40 mg for blood pressure. Take once daily.  Please schedule a follow up appointment in 6 months.   It was a pleasure to see you today!

## 2020-05-05 NOTE — Assessment & Plan Note (Signed)
Symptoms today representing her classic diverticulitis flare, no improvement in symptoms over last week.  She does have evidence of diverticulitis on colonoscopy, descending and sigmoid colon.   Treat with Cipro 500 mg BID x 10 days and metronidazole 500 mg TID x 10 days.  She will update.

## 2020-05-05 NOTE — Assessment & Plan Note (Signed)
Well managed on sertraline 100 mg and mirtazapine 45 mg, continue same.

## 2020-05-05 NOTE — Assessment & Plan Note (Signed)
Repeat A1C pending today.  She does seem to be having occasional drops in glucose.  Consider discontinuing glipizide as she is on insulin.  Continue Metformin 1000 mg BID, Continue Novlolin 70/30 20 units BID. Await results.

## 2020-05-05 NOTE — Assessment & Plan Note (Signed)
Well controlled on lisinopril 10 mg but did have an episode of tongue swelling a few months ago.  Discontinue lisinopril to prevent angioedema or future tongue swelling.  Start valsartan 40 mg daily. She will monitor BP and update with any changes.   CMP pending today.

## 2020-05-05 NOTE — Assessment & Plan Note (Signed)
Compliant to atorvastatin 20 mg, continue same. Lipid panel pending. 

## 2020-05-06 ENCOUNTER — Other Ambulatory Visit: Payer: Self-pay | Admitting: Primary Care

## 2020-05-08 DIAGNOSIS — K5792 Diverticulitis of intestine, part unspecified, without perforation or abscess without bleeding: Secondary | ICD-10-CM

## 2020-05-09 MED ORDER — AMOXICILLIN-POT CLAVULANATE 875-125 MG PO TABS
1.0000 | ORAL_TABLET | Freq: Two times a day (BID) | ORAL | 0 refills | Status: DC
Start: 2020-05-09 — End: 2020-09-10

## 2020-05-12 ENCOUNTER — Other Ambulatory Visit: Payer: Self-pay | Admitting: Primary Care

## 2020-05-12 DIAGNOSIS — D649 Anemia, unspecified: Secondary | ICD-10-CM

## 2020-05-16 ENCOUNTER — Other Ambulatory Visit (INDEPENDENT_AMBULATORY_CARE_PROVIDER_SITE_OTHER): Payer: Self-pay

## 2020-05-16 ENCOUNTER — Other Ambulatory Visit: Payer: Self-pay

## 2020-05-16 DIAGNOSIS — D649 Anemia, unspecified: Secondary | ICD-10-CM

## 2020-05-16 LAB — CBC
HCT: 30.9 % — ABNORMAL LOW (ref 36.0–46.0)
Hemoglobin: 10.2 g/dL — ABNORMAL LOW (ref 12.0–15.0)
MCHC: 33.1 g/dL (ref 30.0–36.0)
MCV: 75.9 fl — ABNORMAL LOW (ref 78.0–100.0)
Platelets: 297 10*3/uL (ref 150.0–400.0)
RBC: 4.07 Mil/uL (ref 3.87–5.11)
RDW: 16.3 % — ABNORMAL HIGH (ref 11.5–15.5)
WBC: 13.4 10*3/uL — ABNORMAL HIGH (ref 4.0–10.5)

## 2020-05-16 LAB — IBC + FERRITIN
Ferritin: 42.3 ng/mL (ref 10.0–291.0)
Iron: 6 ug/dL — ABNORMAL LOW (ref 42–145)
Saturation Ratios: 1.6 % — ABNORMAL LOW (ref 20.0–50.0)
Transferrin: 275 mg/dL (ref 212.0–360.0)

## 2020-05-19 ENCOUNTER — Other Ambulatory Visit: Payer: Self-pay | Admitting: Primary Care

## 2020-05-19 DIAGNOSIS — F419 Anxiety disorder, unspecified: Secondary | ICD-10-CM

## 2020-05-19 DIAGNOSIS — F32A Depression, unspecified: Secondary | ICD-10-CM

## 2020-06-09 ENCOUNTER — Other Ambulatory Visit: Payer: Self-pay

## 2020-06-10 ENCOUNTER — Other Ambulatory Visit: Payer: Self-pay | Admitting: Primary Care

## 2020-06-10 DIAGNOSIS — D509 Iron deficiency anemia, unspecified: Secondary | ICD-10-CM

## 2020-06-19 ENCOUNTER — Other Ambulatory Visit: Payer: Self-pay

## 2020-06-19 ENCOUNTER — Other Ambulatory Visit (INDEPENDENT_AMBULATORY_CARE_PROVIDER_SITE_OTHER): Payer: Self-pay

## 2020-06-19 DIAGNOSIS — D509 Iron deficiency anemia, unspecified: Secondary | ICD-10-CM

## 2020-06-19 LAB — CBC WITH DIFFERENTIAL/PLATELET
Basophils Absolute: 0.1 10*3/uL (ref 0.0–0.1)
Basophils Relative: 0.9 % (ref 0.0–3.0)
Eosinophils Absolute: 0.2 10*3/uL (ref 0.0–0.7)
Eosinophils Relative: 1.7 % (ref 0.0–5.0)
HCT: 29.8 % — ABNORMAL LOW (ref 36.0–46.0)
Hemoglobin: 9.8 g/dL — ABNORMAL LOW (ref 12.0–15.0)
Lymphocytes Relative: 22.6 % (ref 12.0–46.0)
Lymphs Abs: 2 10*3/uL (ref 0.7–4.0)
MCHC: 33 g/dL (ref 30.0–36.0)
MCV: 75.3 fl — ABNORMAL LOW (ref 78.0–100.0)
Monocytes Absolute: 0.8 10*3/uL (ref 0.1–1.0)
Monocytes Relative: 9.2 % (ref 3.0–12.0)
Neutro Abs: 5.8 10*3/uL (ref 1.4–7.7)
Neutrophils Relative %: 65.6 % (ref 43.0–77.0)
Platelets: 232 10*3/uL (ref 150.0–400.0)
RBC: 3.96 Mil/uL (ref 3.87–5.11)
RDW: 17.1 % — ABNORMAL HIGH (ref 11.5–15.5)
WBC: 8.9 10*3/uL (ref 4.0–10.5)

## 2020-06-19 LAB — IBC + FERRITIN
Ferritin: 25.5 ng/mL (ref 10.0–291.0)
Iron: 27 ug/dL — ABNORMAL LOW (ref 42–145)
Saturation Ratios: 6.4 % — ABNORMAL LOW (ref 20.0–50.0)
Transferrin: 302 mg/dL (ref 212.0–360.0)

## 2020-06-20 ENCOUNTER — Other Ambulatory Visit: Payer: Self-pay | Admitting: Primary Care

## 2020-06-20 DIAGNOSIS — F419 Anxiety disorder, unspecified: Secondary | ICD-10-CM

## 2020-06-20 DIAGNOSIS — F32A Depression, unspecified: Secondary | ICD-10-CM

## 2020-06-20 DIAGNOSIS — E785 Hyperlipidemia, unspecified: Secondary | ICD-10-CM

## 2020-06-23 ENCOUNTER — Other Ambulatory Visit: Payer: Self-pay

## 2020-06-24 ENCOUNTER — Other Ambulatory Visit: Payer: Self-pay | Admitting: Primary Care

## 2020-06-24 DIAGNOSIS — D509 Iron deficiency anemia, unspecified: Secondary | ICD-10-CM

## 2020-08-04 ENCOUNTER — Other Ambulatory Visit: Payer: Self-pay

## 2020-08-11 ENCOUNTER — Other Ambulatory Visit: Payer: Self-pay

## 2020-08-11 ENCOUNTER — Other Ambulatory Visit (INDEPENDENT_AMBULATORY_CARE_PROVIDER_SITE_OTHER): Payer: Self-pay

## 2020-08-11 DIAGNOSIS — D509 Iron deficiency anemia, unspecified: Secondary | ICD-10-CM

## 2020-08-11 LAB — CBC
HCT: 30.8 % — ABNORMAL LOW (ref 36.0–46.0)
Hemoglobin: 10 g/dL — ABNORMAL LOW (ref 12.0–15.0)
MCHC: 32.3 g/dL (ref 30.0–36.0)
MCV: 75.4 fl — ABNORMAL LOW (ref 78.0–100.0)
Platelets: 232 10*3/uL (ref 150.0–400.0)
RBC: 4.09 Mil/uL (ref 3.87–5.11)
RDW: 17 % — ABNORMAL HIGH (ref 11.5–15.5)
WBC: 8.4 10*3/uL (ref 4.0–10.5)

## 2020-08-11 LAB — IBC + FERRITIN
Ferritin: 27.7 ng/mL (ref 10.0–291.0)
Iron: 16 ug/dL — ABNORMAL LOW (ref 42–145)
Saturation Ratios: 4.2 % — ABNORMAL LOW (ref 20.0–50.0)
Transferrin: 272 mg/dL (ref 212.0–360.0)

## 2020-08-13 NOTE — Progress Notes (Signed)
Patient called back would like to referral to be sent to [facility] she has spoke to someone in financial office there and started everything for patient assistance.

## 2020-08-14 ENCOUNTER — Other Ambulatory Visit: Payer: Self-pay | Admitting: Primary Care

## 2020-08-14 DIAGNOSIS — D509 Iron deficiency anemia, unspecified: Secondary | ICD-10-CM

## 2020-08-20 ENCOUNTER — Telehealth: Payer: Self-pay | Admitting: Primary Care

## 2020-08-20 NOTE — Telephone Encounter (Signed)
Referral was sent to [facility] GI office, I called to follow up on this and their office was closed for the day already. Will try again tomorrow

## 2020-08-20 NOTE — Telephone Encounter (Signed)
Mrs. Morgan Small called in and wanted to know about referral for the gastroenterologist.

## 2020-08-21 NOTE — Telephone Encounter (Signed)
Called [facility] GI office to follow [phone] Morgan Small at the office they reached out to the patient several times by text message and phone calls and have not heard back. I called patient to advise her of this information and she stated she does not recall getting any messages or calls from their office. Encouraged patient to call now and get scheduled as the office is ready to set this up. Patient verbalized understanding and will call now.

## 2020-08-22 ENCOUNTER — Other Ambulatory Visit: Payer: Self-pay | Admitting: Primary Care

## 2020-08-22 DIAGNOSIS — E119 Type 2 diabetes mellitus without complications: Secondary | ICD-10-CM

## 2020-08-25 ENCOUNTER — Telehealth: Payer: Self-pay

## 2020-08-25 NOTE — Telephone Encounter (Signed)
Fax received from [facility]. Have not been able to contact patient to set up appointment. Please advise.

## 2020-08-26 NOTE — Telephone Encounter (Signed)
Did you notify patient that [facility] GI has been unable able to reach her?

## 2020-08-29 NOTE — Telephone Encounter (Signed)
Patient stated she has appointment with [facility] 09-10-20.

## 2020-09-08 ENCOUNTER — Telehealth: Payer: Self-pay

## 2020-09-08 NOTE — Telephone Encounter (Signed)
Pt called in stating that she is having another diverticulitis flare up and doesn't have an appt with [facility] until 10/10/20. She is asking for amoxicillin and something for the pain she is having. Please advise.

## 2020-09-08 NOTE — Telephone Encounter (Signed)
Pt called back, again, asking if [name] had addressed the message. I advised her Morgan Small is out of the office on Mondays. If she was having issues bad enough to need medication, I think she needs to go to an UC or ER. Pt declined stating she would wait for Morgan Small to respond tomorrow.

## 2020-09-08 NOTE — Telephone Encounter (Signed)
Will defer to PCP return tomorrow 

## 2020-09-09 NOTE — Telephone Encounter (Signed)
Can we get her in the office today (06/07 or 06/08) or tomorrow?

## 2020-09-09 NOTE — Telephone Encounter (Signed)
Yes, virtual is fine

## 2020-09-09 NOTE — Telephone Encounter (Signed)
Called and spoke to patient she can not get in the office. Wanted to know if she can do as virtual? I have an appointment held for tomorrow. Let me know and I will call patient back.

## 2020-09-09 NOTE — Telephone Encounter (Signed)
Called app has been added pt aware

## 2020-09-10 ENCOUNTER — Telehealth (INDEPENDENT_AMBULATORY_CARE_PROVIDER_SITE_OTHER): Payer: Self-pay | Admitting: Primary Care

## 2020-09-10 ENCOUNTER — Other Ambulatory Visit: Payer: Self-pay

## 2020-09-10 ENCOUNTER — Encounter: Payer: Self-pay | Admitting: Primary Care

## 2020-09-10 DIAGNOSIS — K5792 Diverticulitis of intestine, part unspecified, without perforation or abscess without bleeding: Secondary | ICD-10-CM

## 2020-09-10 MED ORDER — TRAMADOL HCL 50 MG PO TABS: 50.0000 mg | ORAL_TABLET | Freq: Three times a day (TID) | ORAL | 0 refills | Status: AC | PRN

## 2020-09-10 MED ORDER — AMOXICILLIN-POT CLAVULANATE 875-125 MG PO TABS: 1.0000 | ORAL_TABLET | Freq: Two times a day (BID) | ORAL | 0 refills | Status: DC

## 2020-09-10 NOTE — Assessment & Plan Note (Signed)
Suspected given her history and information in HPI. She does appear stable, not toxic.  Agree to treat with antibiotics, especially given the duration of symptoms and location of pain. Rx for Augmentin course provided for treatment, Rx for Tramadol 50 mg provided to use PRN pain.  She will follow up with GI as scheduled. ED precautions provided.

## 2020-09-10 NOTE — Patient Instructions (Signed)
Start Augmentin antibiotics for the infection Take 1 tablet by mouth twice daily for 10 days.  You may take Tramadol 50 mg every 8 hours as needed for pain.  Go to the hospital if you start to feel worse and/or if you do not improve.  It was a pleasure to see you today!

## 2020-09-10 NOTE — Progress Notes (Signed)
Patient ID: Morgan Small, female    DOB: 05-23-1960, 60 y.o.   MRN: [id number]  Virtual visit completed through [employer], a video enabled telemedicine application. Due to national recommendations of social distancing due to COVID-19, a virtual visit is felt to be most appropriate for this patient at this time. Reviewed limitations, risks, security and privacy concerns of performing a virtual visit and the availability of in person appointments. I also reviewed that there may be a patient responsible charge related to this service. The patient agreed to proceed.   Patient location: home Provider location: [place] at [facility], office Persons participating in this virtual visit: patient, provider   If any vitals were documented, they were collected by patient at home unless specified below.    Ht 5\' 5"  (1.651 m)   Wt 172 lb (78 kg)   BMI 28.62 kg/m    CC: abdominal pain Subjective:   HPI: Morgan Small is a 60 y.o. female with a history of acute diverticulitis, type 2 diabetes, anxiety and depression presenting on 09/10/2020 for with a chief complaint of abdominal pain.  She believes she's experiencing a diverticulitis flare. Her pain is located to the abdomen which began about 10 days ago for which she describes as "pulling, dull constant" pain with intermittent sharp pain. Over the last 3-4 days her pain has progressed. She denies diarrhea, bloody stools, fevers. This pain feels exactly like her prior diverticulitis pain.  Overall she's feeling fatigued and drained. She's been taking Ibuprofen with temporary improvement, but now the Ibuprofen isn't helping. She has an appointment with GI on July 8th but couldn't wait until then.       Relevant past medical, surgical, family and social history reviewed and updated as indicated. Interim medical history since our last visit reviewed. Allergies and medications reviewed and updated. Outpatient Medications Prior to Visit  Medication  Sig Dispense Refill  . ACCU-CHEK AVIVA PLUS test strip USE TO TEST BLOOD SUGAR EVERY MORNING AND 2HRS AFTER DINNER 100 each 1  . atorvastatin (LIPITOR) 20 MG tablet TAKE 1 TABLET BY MOUTH ONCE DAILY FOR CHOLESTEROL 90 tablet 3  . blood glucose meter kit and supplies KIT Test every morning before breakfast and 2 hours after dinner. 1 each 0  . insulin NPH-regular Human (NOVOLIN 70/30 RELION) (70-30) 100 UNIT/ML injection Inject 20 Units into the skin 2 (two) times daily with a meal. For diabetes. 10 mL 2  . Insulin Pen Needle (PEN NEEDLES) 31G X 6 MM MISC Injection nightly with insulin pen. 100 each 5  . Insulin Syringe-Needle U-100 (INSULIN SYRINGE 1CC/31GX5/16") 31G X 5/16" 1 ML MISC Use as instructed in inject insulin daily. E11.9 100 each 5  . metFORMIN (GLUCOPHAGE) 1000 MG tablet TAKE 1 TABLET BY MOUTH TWICE A DAY WITH MEAL FOR DAIBETES 180 tablet 1  . mirtazapine (REMERON) 45 MG tablet Take 1 tablet (45 mg total) by mouth at bedtime. For sleep. 90 tablet 3  . sertraline (ZOLOFT) 100 MG tablet TAKE 1 TABLET BY MOUTH DAILY FOR ANXIETYAND DEPRESSION 90 tablet 3  . valsartan (DIOVAN) 40 MG tablet Take 1 tablet (40 mg total) by mouth daily. For blood pressure. 90 tablet 3  . amoxicillin-clavulanate (AUGMENTIN) 875-125 MG tablet Take 1 tablet by mouth 2 (two) times daily. 20 tablet 0  . vitamin B-12 (CYANOCOBALAMIN) 1000 MCG tablet Take 1,000 mcg by mouth daily.     No facility-administered medications prior to visit.     Per HPI unless  specifically indicated in ROS section below Review of Systems Objective:  Ht 5\' 5"  (1.651 m)   Wt 172 lb (78 kg)   BMI 28.62 kg/m   Wt Readings from Last 3 Encounters:  09/10/20 172 lb (78 kg)  05/05/20 179 lb 9.6 oz (81.5 kg)  11/02/19 172 lb 8 oz (78.2 kg)       Physical exam: Gen: alert, NAD, appears fatigued  Pulm: speaks in complete sentences without increased work of breathing Psych: normal mood, normal thought content      Results for orders  placed or performed in visit on 08/11/20  IBC + Ferritin  Result Value Ref Range   Iron 16 (L) 42 - 145 ug/dL   Transferrin 10/11/20 497.5 - 360.0 mg/dL   Saturation Ratios 4.2 (L) 20.0 - 50.0 %   Ferritin 27.7 10.0 - 291.0 ng/mL  CBC  Result Value Ref Range   WBC 8.4 4.0 - 10.5 K/uL   RBC 4.09 3.87 - 5.11 Mil/uL   Platelets 232.0 150.0 - 400.0 K/uL   Hemoglobin 10.0 (L) 12.0 - 15.0 g/dL   HCT 300.5 (L) 11.0 - 21.1 %   MCV 75.4 (L) 78.0 - 100.0 fl   MCHC 32.3 30.0 - 36.0 g/dL   RDW 17.3 (H) 56.7 - 01.4 %   Assessment & Plan:   Problem List Items Addressed This Visit      Digestive   Acute diverticulitis    Suspected given her history and information in HPI. She does appear stable, not toxic.  Agree to treat with antibiotics, especially given the duration of symptoms and location of pain. Rx for Augmentin course provided for treatment, Rx for Tramadol 50 mg provided to use PRN pain.  She will follow up with GI as scheduled. ED precautions provided.       Relevant Medications   amoxicillin-clavulanate (AUGMENTIN) 875-125 MG tablet   traMADol (ULTRAM) 50 MG tablet       Meds ordered this encounter  Medications  . amoxicillin-clavulanate (AUGMENTIN) 875-125 MG tablet    Sig: Take 1 tablet by mouth 2 (two) times daily.    Dispense:  20 tablet    Refill:  0    Order Specific Question:   Supervising Provider    Answer:   BEDSOLE, AMY E [[id number]]  . traMADol (ULTRAM) 50 MG tablet    Sig: Take 1 tablet (50 mg total) by mouth every 8 (eight) hours as needed for up to 5 days for severe pain.    Dispense:  15 tablet    Refill:  0    Order Specific Question:   Supervising Provider    Answer:   BEDSOLE, AMY E [[id number]]   No orders of the defined types were placed in this encounter.   I discussed the assessment and treatment plan with the patient. The patient was provided an opportunity to ask questions and all were answered. The patient agreed with the plan and demonstrated an  understanding of the instructions. The patient was advised to call back or seek an in-person evaluation if the symptoms worsen or if the condition fails to improve as anticipated.  Follow up plan:  Start Augmentin antibiotics for the infection Take 1 tablet by mouth twice daily for 10 days.  You may take Tramadol 50 mg every 8 hours as needed for pain.  Go to the hospital if you start to feel worse and/or if you do not improve.  It was a pleasure to see you today!  Pleas Koch, NP

## 2020-10-30 ENCOUNTER — Other Ambulatory Visit: Payer: Self-pay

## 2020-10-30 ENCOUNTER — Ambulatory Visit (INDEPENDENT_AMBULATORY_CARE_PROVIDER_SITE_OTHER): Payer: Self-pay | Admitting: Primary Care

## 2020-10-30 ENCOUNTER — Encounter: Payer: Self-pay | Admitting: Primary Care

## 2020-10-30 VITALS — BP 120/62 | HR 93 | Temp 97.8°F | Ht 65.0 in | Wt 177.0 lb

## 2020-10-30 DIAGNOSIS — F32A Depression, unspecified: Secondary | ICD-10-CM

## 2020-10-30 DIAGNOSIS — E785 Hyperlipidemia, unspecified: Secondary | ICD-10-CM

## 2020-10-30 DIAGNOSIS — E119 Type 2 diabetes mellitus without complications: Secondary | ICD-10-CM

## 2020-10-30 DIAGNOSIS — I10 Essential (primary) hypertension: Secondary | ICD-10-CM

## 2020-10-30 DIAGNOSIS — D509 Iron deficiency anemia, unspecified: Secondary | ICD-10-CM | POA: Insufficient documentation

## 2020-10-30 DIAGNOSIS — F419 Anxiety disorder, unspecified: Secondary | ICD-10-CM

## 2020-10-30 LAB — CBC
HCT: 33.1 % — ABNORMAL LOW (ref 36.0–46.0)
Hemoglobin: 10.5 g/dL — ABNORMAL LOW (ref 12.0–15.0)
MCHC: 31.8 g/dL (ref 30.0–36.0)
MCV: 76.7 fl — ABNORMAL LOW (ref 78.0–100.0)
Platelets: 217 10*3/uL (ref 150.0–400.0)
RBC: 4.32 Mil/uL (ref 3.87–5.11)
RDW: 18.8 % — ABNORMAL HIGH (ref 11.5–15.5)
WBC: 10.3 10*3/uL (ref 4.0–10.5)

## 2020-10-30 LAB — LIPID PANEL
Cholesterol: 116 mg/dL (ref 0–200)
HDL: 31 mg/dL — ABNORMAL LOW (ref >39.00)
LDL Cholesterol: 57 mg/dL (ref 0–99)
NonHDL: 84.71
Total CHOL/HDL Ratio: 4
Triglycerides: 137 mg/dL (ref 0.0–149.0)
VLDL: 27.4 mg/dL (ref 0.0–40.0)

## 2020-10-30 LAB — COMPREHENSIVE METABOLIC PANEL
ALT: 21 U/L (ref 0–35)
AST: 33 U/L (ref 0–37)
Albumin: 3.7 g/dL (ref 3.5–5.2)
Alkaline Phosphatase: 89 U/L (ref 39–117)
BUN: 12 mg/dL (ref 6–23)
CO2: 28 mEq/L (ref 19–32)
Calcium: 9.3 mg/dL (ref 8.4–10.5)
Chloride: 103 mEq/L (ref 96–112)
Creatinine, Ser: 0.83 mg/dL (ref 0.40–1.20)
GFR: 76.16 mL/min (ref >60.00)
Glucose, Bld: 67 mg/dL — ABNORMAL LOW (ref 70–99)
Potassium: 3.7 mEq/L (ref 3.5–5.1)
Sodium: 140 mEq/L (ref 135–145)
Total Bilirubin: 0.3 mg/dL (ref 0.2–1.2)
Total Protein: 7.8 g/dL (ref 6.0–8.3)

## 2020-10-30 LAB — IBC + FERRITIN
Ferritin: 30.7 ng/mL (ref 10.0–291.0)
Iron: 23 ug/dL — ABNORMAL LOW (ref 42–145)
Saturation Ratios: 6 % — ABNORMAL LOW (ref 20.0–50.0)
Transferrin: 275 mg/dL (ref 212.0–360.0)

## 2020-10-30 LAB — HEMOGLOBIN A1C: Hgb A1c MFr Bld: 7.1 % — ABNORMAL HIGH (ref 4.6–6.5)

## 2020-10-30 NOTE — Assessment & Plan Note (Signed)
Doing well on sertraline 100 mg and mirtazapine 45 mg daily, continue same.

## 2020-10-30 NOTE — Assessment & Plan Note (Signed)
Well controlled in the office today, continue valsartan 40 mg. CMP pending.

## 2020-10-30 NOTE — Assessment & Plan Note (Signed)
Compliant to her regimen of Novolin 70/30 20 units BID and metformin 1000 mg BID. Continue same.  Repeat A1C pending. Discussed to schedule eye exam. Managed on ARB and statin.  Follow up in 6 months.

## 2020-10-30 NOTE — Patient Instructions (Signed)
Stop by the lab prior to leaving today. I will notify you of your results once received.   Please schedule a follow up visit for 6 months.  It was a pleasure to see you today!    

## 2020-10-30 NOTE — Progress Notes (Signed)
Subjective:    Patient ID: Morgan Small, female    DOB: [birth year], 60 y.o.   MRN: [id number]  HPI  Morgan Small is a very pleasant 60 y.o. female with a history of hypertension, type 2 diabetes, hyperlipidemia who presents today for follow up of chronic conditions.   1) Chronic Anemia: Chronic iron deficiency anemia. She is compliant to her oral iron once daily and is feeling more energy and is back to her household duties. She denies vaginal and rectal bleeding. Recently evaluated by GI who is wanting a repeat colonoscopy but she cannot afford it at this time. She plans on doing this in November 2022.  2) Type 2 Diabetes:   Current medications include: Novolin 70/30 20 units BID, metformin 1000 mg BID.   She is checking her blood glucose 1 times daily around mid morning 2 hours after eating and is getting readings of 120-130's  Last A1C: 7.1 in January 2022 Last Eye Exam: Due Last Foot Exam: Due Pneumonia Vaccination:2018 Urine Microalbumin: none, ARB Statin: atorvastatin   3) Essential Hypertension: Currently managed on Valsartan 40 mg daily. Denies chest pain.   BP Readings from Last 3 Encounters:  10/30/20 120/62  05/05/20 124/68  11/02/19 122/70   4) Anxiety and Depression: Currently managed on sertraline 100 mg daily. Feels very well managed on sertraline 100 mg daily and mirtazapine 45 mg HS for sleep.    Review of Systems  Constitutional:  Negative for fatigue.  Respiratory:  Negative for shortness of breath.   Cardiovascular:  Negative for chest pain.  Gastrointestinal:  Negative for blood in stool.  Genitourinary:  Negative for vaginal bleeding.  Psychiatric/Behavioral:  The patient is not nervous/anxious.         Past Medical History:  Diagnosis Date   Acute diverticulitis 11/22/2018   Arthritis    Depression    Diabetes mellitus without complication (HCC)    Diverticulitis    Esophageal ulcer    GAD (generalized anxiety disorder)    GERD  (gastroesophageal reflux disease)    Hyperlipidemia    Hypertension     Social History   Socioeconomic History   Marital status: Married    Spouse name: Not on file   Number of children: Not on file   Years of education: Not on file   Highest education level: Not on file  Occupational History   Not on file  Tobacco Use   Smoking status: Every Day    Packs/day: 1.00    Types: Cigarettes   Smokeless tobacco: Never  Substance and Sexual Activity   Alcohol use: Yes    Comment: occasional    Drug use: Yes   Sexual activity: Not on file  Other Topics Concern   Not on file  Social History Narrative   Not on file   Social Determinants of Health   Financial Resource Strain: Not on file  Food Insecurity: Not on file  Transportation Needs: Not on file  Physical Activity: Not on file  Stress: Not on file  Social Connections: Not on file  Intimate Partner Violence: Not on file    Past Surgical History:  Procedure Laterality Date   APPENDECTOMY  1994   COLONOSCOPY WITH PROPOFOL N/A 02/03/2015   Procedure: COLONOSCOPY WITH PROPOFOL;  Surgeon: Hulen Luster, MD;  Location: [facility] ENDOSCOPY;  Service: Gastroenterology;  Laterality: N/A;   ESOPHAGOGASTRODUODENOSCOPY (EGD) WITH PROPOFOL N/A 02/03/2015   Procedure: ESOPHAGOGASTRODUODENOSCOPY (EGD) WITH PROPOFOL;  Surgeon: Hulen Luster, MD;  Location: [facility] ENDOSCOPY;  Service: Gastroenterology;  Laterality: N/A;   GALLBLADDER SURGERY  1989    Family History  Problem Relation Age of Onset   Arthritis Mother    Hyperlipidemia Mother    Hypertension Mother    Mental illness Mother    Diabetes Mother    Arthritis Father    Hypertension Father    Mental illness Father    Alcohol abuse Maternal Grandmother    Learning disabilities Maternal Grandmother    Learning disabilities Maternal Grandfather     Allergies  Allergen Reactions   Lisinopril Swelling    Tongue Swelling    Current Outpatient Medications on File Prior to Visit   Medication Sig Dispense Refill   ACCU-CHEK AVIVA PLUS test strip USE TO TEST BLOOD SUGAR EVERY MORNING AND 2HRS AFTER DINNER 100 each 1   atorvastatin (LIPITOR) 20 MG tablet TAKE 1 TABLET BY MOUTH ONCE DAILY FOR CHOLESTEROL 90 tablet 3   blood glucose meter kit and supplies KIT Test every morning before breakfast and 2 hours after dinner. 1 each 0   insulin NPH-regular Human (NOVOLIN 70/30 RELION) (70-30) 100 UNIT/ML injection Inject 20 Units into the skin 2 (two) times daily with a meal. For diabetes. 10 mL 2   Insulin Pen Needle (PEN NEEDLES) 31G X 6 MM MISC Injection nightly with insulin pen. 100 each 5   Insulin Syringe-Needle U-100 (INSULIN SYRINGE 1CC/31GX5/16") 31G X 5/16" 1 ML MISC Use as instructed in inject insulin daily. E11.9 100 each 5   metFORMIN (GLUCOPHAGE) 1000 MG tablet TAKE 1 TABLET BY MOUTH TWICE A DAY WITH MEAL FOR DAIBETES 180 tablet 1   mirtazapine (REMERON) 45 MG tablet Take 1 tablet (45 mg total) by mouth at bedtime. For sleep. 90 tablet 3   sertraline (ZOLOFT) 100 MG tablet TAKE 1 TABLET BY MOUTH DAILY FOR ANXIETYAND DEPRESSION 90 tablet 3   valsartan (DIOVAN) 40 MG tablet Take 1 tablet (40 mg total) by mouth daily. For blood pressure. 90 tablet 3   ferrous sulfate 325 (65 FE) MG tablet Take by mouth.     No current facility-administered medications on file prior to visit.    BP 120/62   Pulse 93   Temp 97.8 F (36.6 C) (Temporal)   Ht '5\' 5"'  (1.651 m)   Wt 177 lb (80.3 kg)   SpO2 97%   BMI 29.45 kg/m  Objective:   Physical Exam Cardiovascular:     Rate and Rhythm: Normal rate and regular rhythm.  Pulmonary:     Effort: Pulmonary effort is normal.     Breath sounds: Normal breath sounds.  Musculoskeletal:     Cervical back: Neck supple.  Skin:    General: Skin is warm and dry.  Psychiatric:        Mood and Affect: Mood normal.          Assessment & Plan:      This visit occurred during the SARS-CoV-2 public health emergency.  Safety  protocols were in place, including screening questions prior to the visit, additional usage of staff PPE, and extensive cleaning of exam room while observing appropriate contact time as indicated for disinfecting solutions.

## 2020-10-30 NOTE — Assessment & Plan Note (Signed)
Compliant to atorvastatin 20 mg, continue same.  Repeat lipid panel pending. 

## 2020-10-30 NOTE — Assessment & Plan Note (Signed)
Significant improvement in symptoms of fatigue. Agree that she needs repeat colonoscopy, she will have this done in November when she can afford to complete.   No vaginal or rectal bleeding.  Repeat CBC and iron studies pending.

## 2021-03-13 ENCOUNTER — Other Ambulatory Visit: Payer: Self-pay | Admitting: Primary Care

## 2021-03-13 DIAGNOSIS — E119 Type 2 diabetes mellitus without complications: Secondary | ICD-10-CM

## 2021-04-08 ENCOUNTER — Ambulatory Visit: Payer: Self-pay | Admitting: Primary Care

## 2021-04-13 ENCOUNTER — Telehealth: Payer: Self-pay | Admitting: Primary Care

## 2021-04-13 NOTE — Telephone Encounter (Signed)
Patient's husband has called into the office to inform the provider that the pt will need her abdominal stitches removed tomorrow during their appointment. The area is red and they are needing to come out.   Please call pt husband with any questions

## 2021-04-14 ENCOUNTER — Other Ambulatory Visit: Payer: Self-pay

## 2021-04-14 ENCOUNTER — Encounter: Payer: Self-pay | Admitting: Primary Care

## 2021-04-14 ENCOUNTER — Ambulatory Visit (INDEPENDENT_AMBULATORY_CARE_PROVIDER_SITE_OTHER): Payer: Self-pay | Admitting: Primary Care

## 2021-04-14 VITALS — BP 120/62 | HR 76 | Temp 97.8°F | Ht 65.0 in | Wt 145.0 lb

## 2021-04-14 DIAGNOSIS — Z9049 Acquired absence of other specified parts of digestive tract: Secondary | ICD-10-CM

## 2021-04-14 DIAGNOSIS — D509 Iron deficiency anemia, unspecified: Secondary | ICD-10-CM

## 2021-04-14 DIAGNOSIS — E119 Type 2 diabetes mellitus without complications: Secondary | ICD-10-CM

## 2021-04-14 DIAGNOSIS — K5720 Diverticulitis of large intestine with perforation and abscess without bleeding: Secondary | ICD-10-CM

## 2021-04-14 HISTORY — DX: Acquired absence of other specified parts of digestive tract: Z90.49

## 2021-04-14 LAB — IBC + FERRITIN
Ferritin: 104.5 ng/mL (ref 10.0–291.0)
Iron: 24 ug/dL — ABNORMAL LOW (ref 42–145)
Saturation Ratios: 10.1 % — ABNORMAL LOW (ref 20.0–50.0)
TIBC: 238 ug/dL — ABNORMAL LOW (ref 250.0–450.0)
Transferrin: 170 mg/dL — ABNORMAL LOW (ref 212.0–360.0)

## 2021-04-14 LAB — BASIC METABOLIC PANEL
BUN: 12 mg/dL (ref 6–23)
CO2: 25 mEq/L (ref 19–32)
Calcium: 7.9 mg/dL — ABNORMAL LOW (ref 8.4–10.5)
Chloride: 105 mEq/L (ref 96–112)
Creatinine, Ser: 0.71 mg/dL (ref 0.40–1.20)
GFR: 91.57 mL/min (ref >60.00)
Glucose, Bld: 118 mg/dL — ABNORMAL HIGH (ref 70–99)
Potassium: 3.7 mEq/L (ref 3.5–5.1)
Sodium: 138 mEq/L (ref 135–145)

## 2021-04-14 LAB — CBC
HCT: 32.3 % — ABNORMAL LOW (ref 36.0–46.0)
Hemoglobin: 10.1 g/dL — ABNORMAL LOW (ref 12.0–15.0)
MCHC: 31.3 g/dL (ref 30.0–36.0)
MCV: 81.2 fl (ref 78.0–100.0)
Platelets: 297 10*3/uL (ref 150.0–400.0)
RBC: 3.97 Mil/uL (ref 3.87–5.11)
RDW: 18.1 % — ABNORMAL HIGH (ref 11.5–15.5)
WBC: 8.4 10*3/uL (ref 4.0–10.5)

## 2021-04-14 NOTE — Assessment & Plan Note (Signed)
Remain off of insulin for now, will discontinue from med list. Resume metformin 1000 mg twice daily as long as this does not cause diarrhea or GI upset.  Reviewed A1c from hospitalization through care everywhere, 6.1 which is under excellent control.  We will plan to see her back in 3 months for follow-up of diabetes.  She will reach out sooner if glucose levels begin to consistently remain at or above 150.

## 2021-04-14 NOTE — Patient Instructions (Signed)
Stop by the lab prior to leaving today. I will notify you of your results once received.   We removed 22 staples from your abdomen today. Follow up with your surgeon.   Remain off insulin for now. Resume metformin 1000 mg twice daily as long as this doesn't cause diarrhea.   Monitor your blood sugars 1-2 times daily, notify me if they consistently remain at or above 150.  We will see you back in 3 months.  It was a pleasure to see you today!

## 2021-04-14 NOTE — Assessment & Plan Note (Signed)
Anemia noted from recent hospitalization through care everywhere.  Repeat iron levels and CBC pending. She is not taking oral iron.

## 2021-04-14 NOTE — Telephone Encounter (Signed)
FYI I have added info to appointment notes.

## 2021-04-14 NOTE — Assessment & Plan Note (Signed)
Recent hospital admission through [facility], requiring exploratory laparotomy, I&D of abscess, abdominal debridement, placement of 2 JP drains, partial colectomy.  Surgical and hospital notes reviewed through care everywhere from December 2022.  22 staples removed from mid anterior abdomen, patient tolerated well.  Site cleansed and dressing applied.  Ostomy bag appears to be stable.  JP drain appears stable.  Follow-up with surgeon as scheduled.

## 2021-04-14 NOTE — Progress Notes (Signed)
Subjective:    Patient ID: Morgan Small, female    DOB: 11-18-1960, 61 y.o.   MRN: [id number]  HPI  Morgan Small is a very pleasant 61 y.o. female with a history of hypertension, type 2 diabetes, diverticulitis, hyperlipidemia who presents today for staple removal and hospital follow up.  She is accompanied by her husband today.  Admitted to [facility] on 03/19/21 for a three day history of LLQ abdominal pain with weakness. She underwent CT abdomen/pelvis in the ED which revealed perforated diverticulitis with large complex fluid collections and concerns for necrotizing soft tissue infection.   During her hospital stay she underwent exploratory laprotomy, Hartmann's (Sigmoid with end descending colostomy), left salpingectomy, I&D of left retroperitoneum from diaphragm down to iliac vessels over the psoas muscle with 2 blake drains placed.  During surgery she was found to have sigmoid mass with perforation and abscess, pathology was negative for cancer.   She was treated with IV antibiotics. Her WBC improved. 1 blake drain removed during hospital visit. She was discharged home on 03/30/21 with 1 drain in place, Rx for Augmentin BID x 4 days, fluconazole 200 mg daily x 4 days, oxycodone 5 mg, pantoprazole 40 mg. She was instructed to have abdominal stables removed 14 days post operatively.   Today she is needing her staples removed. She has been visited by home health nursing twice weekly and home health PT 1-2 times weekly. The nurse noticed yesterday that her staples had become more red.   She is scheduled to see her surgeon on 04/29/21. She denies fevers. Her drain is in place which is draining light yellow. She has continued pantoprazole 40 mg and will finish the prescription as planned.   A1C of 6.1 in December 2022. Her metformin was held during her hospital stay and they did not give her insulin. She's not had her insulin in several weeks, never resumed when she came home.  She is not  checking her glucose levels at home.    She is feeling sluggish and tired, is concerned that her iron is low again. She was not provided her iron pills in the hospital. She has not resumed her iron pills.   Her husband completes her dressing changes and monitors her daily.  She denies abdominal pain, diarrhea, constipation.  She does have some abdominal "soreness".  She has been increasing her diet gradually and is eating several snacks/meals daily with plenty of water for hydration.  Review of Systems  Constitutional:  Negative for fever.  Gastrointestinal:  Negative for abdominal pain, blood in stool, constipation, diarrhea, nausea and vomiting.  Skin:  Positive for color change.        Past Medical History:  Diagnosis Date   Acute diverticulitis 11/22/2018   Arthritis    Depression    Diabetes mellitus without complication (HCC)    Diverticulitis    Esophageal ulcer    GAD (generalized anxiety disorder)    GERD (gastroesophageal reflux disease)    Hyperlipidemia    Hypertension     Social History   Socioeconomic History   Marital status: Married    Spouse name: Not on file   Number of children: Not on file   Years of education: Not on file   Highest education level: Not on file  Occupational History   Not on file  Tobacco Use   Smoking status: Every Day    Packs/day: 1.00    Types: Cigarettes   Smokeless tobacco: Never  Substance  and Sexual Activity   Alcohol use: Yes    Comment: occasional    Drug use: Yes   Sexual activity: Not on file  Other Topics Concern   Not on file  Social History Narrative   Not on file   Social Determinants of Health   Financial Resource Strain: Not on file  Food Insecurity: Not on file  Transportation Needs: Not on file  Physical Activity: Not on file  Stress: Not on file  Social Connections: Not on file  Intimate Partner Violence: Not on file    Past Surgical History:  Procedure Laterality Date   APPENDECTOMY  1994    COLONOSCOPY WITH PROPOFOL N/A 02/03/2015   Procedure: COLONOSCOPY WITH PROPOFOL;  Surgeon: Hulen Luster, MD;  Location: [facility] ENDOSCOPY;  Service: Gastroenterology;  Laterality: N/A;   ESOPHAGOGASTRODUODENOSCOPY (EGD) WITH PROPOFOL N/A 02/03/2015   Procedure: ESOPHAGOGASTRODUODENOSCOPY (EGD) WITH PROPOFOL;  Surgeon: Hulen Luster, MD;  Location: [facility] ENDOSCOPY;  Service: Gastroenterology;  Laterality: N/A;   GALLBLADDER SURGERY  1989    Family History  Problem Relation Age of Onset   Arthritis Mother    Hyperlipidemia Mother    Hypertension Mother    Mental illness Mother    Diabetes Mother    Arthritis Father    Hypertension Father    Mental illness Father    Alcohol abuse Maternal Grandmother    Learning disabilities Maternal Grandmother    Learning disabilities Maternal Grandfather     Allergies  Allergen Reactions   Lisinopril Swelling    Tongue Swelling    Current Outpatient Medications on File Prior to Visit  Medication Sig Dispense Refill   ACCU-CHEK AVIVA PLUS test strip USE TO TEST BLOOD SUGAR EVERY MORNING AND 2HRS AFTER DINNER 100 each 1   atorvastatin (LIPITOR) 20 MG tablet TAKE 1 TABLET BY MOUTH ONCE DAILY FOR CHOLESTEROL 90 tablet 3   blood glucose meter kit and supplies KIT Test every morning before breakfast and 2 hours after dinner. 1 each 0   ferrous sulfate 325 (65 FE) MG tablet Take by mouth.     insulin NPH-regular Human (NOVOLIN 70/30 RELION) (70-30) 100 UNIT/ML injection Inject 20 Units into the skin 2 (two) times daily with a meal. For diabetes. 10 mL 2   Insulin Pen Needle (PEN NEEDLES) 31G X 6 MM MISC Injection nightly with insulin pen. 100 each 5   Insulin Syringe-Needle U-100 (INSULIN SYRINGE 1CC/31GX5/16") 31G X 5/16" 1 ML MISC Use as instructed in inject insulin daily. E11.9 100 each 5   metFORMIN (GLUCOPHAGE) 1000 MG tablet TAKE 1 TABLET BY MOUTH TWICE A DAY WITH MEAL FOR DAIBETES 180 tablet 0   mirtazapine (REMERON) 45 MG tablet Take 1 tablet (45 mg  total) by mouth at bedtime. For sleep. 90 tablet 3   sertraline (ZOLOFT) 100 MG tablet TAKE 1 TABLET BY MOUTH DAILY FOR ANXIETYAND DEPRESSION 90 tablet 3   valsartan (DIOVAN) 40 MG tablet Take 1 tablet (40 mg total) by mouth daily. For blood pressure. 90 tablet 3   No current facility-administered medications on file prior to visit.    BP 120/62    Pulse 76    Temp 97.8 F (36.6 C) (Temporal)    Ht '5\' 5"'  (1.651 m)    Wt 145 lb (65.8 kg)    SpO2 98%    BMI 24.13 kg/m  Objective:   Physical Exam Cardiovascular:     Rate and Rhythm: Normal rate and regular rhythm.  Pulmonary:  Effort: Pulmonary effort is normal.     Breath sounds: Normal breath sounds.  Abdominal:     General: Bowel sounds are normal. There is no distension.     Palpations: Abdomen is soft.     Tenderness: There is no guarding.     Comments: JP drain in place with light yellow drainage.  Musculoskeletal:     Cervical back: Neck supple.  Skin:    General: Skin is warm and dry.     Comments: 22 abdominal staples midline in place from navel to pelvis region. Darker redness around incision site, no bright erythema or pus noted.          Assessment & Plan:      This visit occurred during the SARS-CoV-2 public health emergency.  Safety protocols were in place, including screening questions prior to the visit, additional usage of staff PPE, and extensive cleaning of exam room while observing appropriate contact time as indicated for disinfecting solutions.

## 2021-04-14 NOTE — Assessment & Plan Note (Signed)
Secondary to perforated colon from diverticulitis.  Ostomy bag appears to be stable without complications. Surgical notes and hospital notes reviewed from care everywhere from December 2022.  Follow-up with surgeon as scheduled. Plan is for reversal within 3 to 12 months.

## 2021-04-17 ENCOUNTER — Telehealth: Payer: Self-pay | Admitting: Primary Care

## 2021-04-17 NOTE — Telephone Encounter (Signed)
Please thank her for the update. Yes, okay to discontinue metformin given her GI upset and recent surgery.  Have them monitor her blood sugars off of all treatment and send me glucose readings via MyChart in 1 week.

## 2021-04-17 NOTE — Addendum Note (Signed)
Addended by: Doreene Nest on: 04/17/2021 01:35 PM   Modules accepted: Orders

## 2021-04-17 NOTE — Telephone Encounter (Signed)
Patient has started back on metformin. Was seen by [facility] and recommended that they call to get ok to stop taking. States that it is causing GI upset. This started after she started back on metformin.

## 2021-04-17 NOTE — Telephone Encounter (Signed)
Called patient reviewed all information and repeated back to me. Will call if any questions.  ? ?

## 2021-04-17 NOTE — Telephone Encounter (Signed)
Left message to return call to our office.  

## 2021-04-17 NOTE — Telephone Encounter (Signed)
Pt husband called and wanted to discuss the metFORMIN (GLUCOPHAGE) 1000 MG tablet it is causing her stool to be to runny for the colostomy bag

## 2021-05-05 ENCOUNTER — Ambulatory Visit: Payer: Self-pay | Admitting: Primary Care

## 2021-05-06 ENCOUNTER — Other Ambulatory Visit: Payer: Self-pay

## 2021-05-06 ENCOUNTER — Telehealth (INDEPENDENT_AMBULATORY_CARE_PROVIDER_SITE_OTHER): Payer: Self-pay | Admitting: Primary Care

## 2021-05-06 ENCOUNTER — Encounter: Payer: Self-pay | Admitting: Primary Care

## 2021-05-06 DIAGNOSIS — M7989 Other specified soft tissue disorders: Secondary | ICD-10-CM

## 2021-05-06 NOTE — Assessment & Plan Note (Addendum)
Limited exam given virtual nature of visit.  We requested she come to our office numerous times, however she refused.  There was evidence of left upper extremity swelling from the mid humeral region to mid left forearm, especially compared to right upper extremity.  No open wounds or apparent infection, however, exam was limited.  Given recent surgical history we need to rule out DVT.  Discussed this with patient today and recommended venous ultrasound for which she declines.  We discussed the risks for an untreated DVT.  She will call her surgeon to see what he thinks. She will notify us if she changes her mind regarding the venous ultrasound.

## 2021-05-06 NOTE — Patient Instructions (Signed)
I recommend we proceed with an ultrasound of your arm to rule out a blood clot.  Please notify me if you change your mind.  It was a pleasure to see you today!

## 2021-05-06 NOTE — Progress Notes (Signed)
Patient ID: Morgan Small, female    DOB: 1961-02-19, 61 y.o.   MRN: [id number]  Virtual visit completed through [place], a video enabled telemedicine application. Due to national recommendations of social distancing due to COVID-19, a virtual visit is felt to be most appropriate for this patient at this time. Reviewed limitations, risks, security and privacy concerns of performing a virtual visit and the availability of in person appointments. I also reviewed that there may be a patient responsible charge related to this service. The patient agreed to proceed.   Patient location: home Provider location: [place] [facility], office Persons participating in this virtual visit: patient, provider   If any vitals were documented, they were collected by patient at home unless specified below.    Ht '5\' 5"'  (1.651 m)    Wt 145 lb (65.8 kg)    BMI 24.13 kg/m    CC: Extremity swelling Subjective:   HPI: Morgan Small is a 61 y.o. female with a history of hypertension, type 2 diabetes, hyperlipidemia, chronic left shoulder pain, diverticulitis with perforation presenting on 05/06/2021 for Edema (Left arm )  She woke up 3-4 days ago and noticed left upper extremity swelling proximal to the left elbow through the lower forearm.  She has no swelling to her right upper extremity, lower extremities, abdomen.  Her swelling has not improved nor has it increased.  She denies fevers, injury/trauma, open wounds, erythema, warmth, pain.  She is s/p significant abdominal surgery for diverticulitis with rupture of the colon in mid December 2022.   We attempted to convert her visit to an in office visit, however she was unable to find a ride to our office.      Relevant past medical, surgical, family and social history reviewed and updated as indicated. Interim medical history since our last visit reviewed. Allergies and medications reviewed and updated. Outpatient Medications Prior to Visit   Medication Sig Dispense Refill   ACCU-CHEK AVIVA PLUS test strip USE TO TEST BLOOD SUGAR EVERY MORNING AND 2HRS AFTER DINNER 100 each 1   atorvastatin (LIPITOR) 20 MG tablet TAKE 1 TABLET BY MOUTH ONCE DAILY FOR CHOLESTEROL 90 tablet 3   blood glucose meter kit and supplies KIT Test every morning before breakfast and 2 hours after dinner. 1 each 0   ferrous sulfate 325 (65 FE) MG tablet Take by mouth.     lidocaine (LIDODERM) 5 % PLEASE SEE ATTACHED FOR DETAILED DIRECTIONS     mirtazapine (REMERON) 45 MG tablet Take 1 tablet (45 mg total) by mouth at bedtime. For sleep. 90 tablet 3   sertraline (ZOLOFT) 100 MG tablet TAKE 1 TABLET BY MOUTH DAILY FOR ANXIETYAND DEPRESSION 90 tablet 3   valsartan (DIOVAN) 40 MG tablet Take 1 tablet (40 mg total) by mouth daily. For blood pressure. 90 tablet 3   pantoprazole (PROTONIX) 40 MG tablet Take 1 tablet by mouth at bedtime. (Patient not taking: Reported on 05/06/2021)     No facility-administered medications prior to visit.     Per HPI unless specifically indicated in ROS section below Review of Systems  Constitutional:  Negative for chills and fever.  Cardiovascular:  Negative for leg swelling.       Left upper extremity edema  Skin:  Negative for color change, rash and wound.  Objective:  Ht '5\' 5"'  (1.651 m)    Wt 145 lb (65.8 kg)    BMI 24.13 kg/m   Wt Readings from Last 3 Encounters:  05/06/21 145 lb (65.8 kg)  04/14/21 145 lb (65.8 kg)  10/30/20 177 lb (80.3 kg)       Physical exam: General: Alert and oriented x 3, no distress, does not appear sickly  Pulmonary: Speaks in complete sentences without increased work of breathing, no cough during visit.  Psychiatric: Normal mood, thought content, and behavior.  Mild swelling noted to left upper extremity from mid humeral region to mid forearm.  No open wounds, no erythema, no decrease in range of motion.  Left upper extremity swollen compared to right upper extremity.     Results for  orders placed or performed in visit on 56/31/49  Basic metabolic panel  Result Value Ref Range   Sodium 138 135 - 145 mEq/L   Potassium 3.7 3.5 - 5.1 mEq/L   Chloride 105 96 - 112 mEq/L   CO2 25 19 - 32 mEq/L   Glucose, Bld 118 (H) 70 - 99 mg/dL   BUN 12 6 - 23 mg/dL   Creatinine, Ser 0.71 0.40 - 1.20 mg/dL   GFR 91.57 >60.00 mL/min   Calcium 7.9 (L) 8.4 - 10.5 mg/dL  CBC  Result Value Ref Range   WBC 8.4 4.0 - 10.5 K/uL   RBC 3.97 3.87 - 5.11 Mil/uL   Platelets 297.0 150.0 - 400.0 K/uL   Hemoglobin 10.1 (L) 12.0 - 15.0 g/dL   HCT 32.3 (L) 36.0 - 46.0 %   MCV 81.2 78.0 - 100.0 fl   MCHC 31.3 30.0 - 36.0 g/dL   RDW 18.1 (H) 11.5 - 15.5 %  IBC + Ferritin  Result Value Ref Range   Iron 24 (L) 42 - 145 ug/dL   Transferrin 170.0 (L) 212.0 - 360.0 mg/dL   Saturation Ratios 10.1 (L) 20.0 - 50.0 %   Ferritin 104.5 10.0 - 291.0 ng/mL   TIBC 238.0 (L) 250.0 - 450.0 mcg/dL   Assessment & Plan:   Problem List Items Addressed This Visit       Other   Left upper extremity swelling    Limited exam given virtual nature of visit.  We requested she come to our office numerous times, however she refused.  There was evidence of left upper extremity swelling from the mid humeral region to mid left forearm, especially compared to right upper extremity.  No open wounds or apparent infection, however, exam was limited.  Given recent surgical history we need to rule out DVT.  Discussed this with patient today and recommended venous ultrasound for which she declines.  She will call her surgeon to see what he thinks. She will notify us if she changes her mind regarding the venous ultrasound.        No orders of the defined types were placed in this encounter.  No orders of the defined types were placed in this encounter.   I discussed the assessment and treatment plan with the patient. The patient was provided an opportunity to ask questions and all were answered. The patient agreed with  the plan and demonstrated an understanding of the instructions. The patient was advised to call back or seek an in-person evaluation if the symptoms worsen or if the condition fails to improve as anticipated.  Follow up plan:  I recommend we proceed with an ultrasound of your arm to rule out a blood clot.  Please notify me if you change your mind.  It was a pleasure to see you today!   Pleas Koch, NP

## 2021-05-07 ENCOUNTER — Other Ambulatory Visit: Payer: Self-pay | Admitting: Primary Care

## 2021-05-07 ENCOUNTER — Ambulatory Visit
Admission: RE | Admit: 2021-05-07 | Discharge: 2021-05-07 | Disposition: A | Discharge status: Other Acute Inpatient Hospital | Payer: Self-pay | Source: Ambulatory Visit | Attending: Primary Care | Admitting: Primary Care

## 2021-05-07 ENCOUNTER — Other Ambulatory Visit: Payer: Self-pay

## 2021-05-07 VITALS — BP 129/71 | HR 98 | Temp 98.8°F | Resp 16

## 2021-05-07 DIAGNOSIS — I10 Essential (primary) hypertension: Secondary | ICD-10-CM

## 2021-05-07 DIAGNOSIS — M7989 Other specified soft tissue disorders: Secondary | ICD-10-CM

## 2021-05-07 DIAGNOSIS — F32A Depression, unspecified: Secondary | ICD-10-CM

## 2021-05-07 DIAGNOSIS — F419 Anxiety disorder, unspecified: Secondary | ICD-10-CM

## 2021-05-07 NOTE — Discharge Instructions (Addendum)
Go to the emergency department for evaluation of your left arm swelling.

## 2021-05-07 NOTE — ED Triage Notes (Signed)
Pt here with left arm swelling x 1 week. Pt was told by her primary provider that she need to have a DVT ruled out. Pt reports heat and tightness in that arm.

## 2021-05-07 NOTE — ED Provider Notes (Signed)
Roderic Palau    CSN: [id number] Arrival date & time: 05/07/21  1249      History   Chief Complaint Chief Complaint  Patient presents with   Arm Swelling    HPI Morgan Small is a 61 y.o. female.  Accompanied by her daughter, patient presents with 6-day history of swelling of her left arm.  Her arm feels tight and warm.  No falls or injury.  No chest pain, shortness of breath, numbness, weakness, paresthesias, or other symptoms.  No treatment at home.  Patient had a video visit with her PCP yesterday; she declined to be seen in person; she declined ultrasound to rule out DVT.   She had ostomy on 03/20/2021 due to diverticulitis.  Her medical history includes diabetes and hypertension.  The history is provided by the patient, a relative and medical records.   Past Medical History:  Diagnosis Date   Acute diverticulitis 11/22/2018   Arthritis    Depression    Diabetes mellitus without complication ([place])    Diverticulitis    Esophageal ulcer    GAD (generalized anxiety disorder)    GERD (gastroesophageal reflux disease)    Hyperlipidemia    Hypertension     Patient Active Problem List   Diagnosis Date Noted   Left upper extremity swelling 05/06/2021   S/P partial colectomy 04/14/2021   Iron deficiency anemia 10/30/2020   Diverticulitis of colon with perforation 11/22/2018   Chronic left shoulder pain 05/19/2017   Preventative health care 09/13/2016   Hyperlipidemia 06/02/2016   Type 2 diabetes mellitus without complication, without long-term current use of insulin ([place]) 06/02/2016   Anxiety and depression 06/02/2016   Benzodiazepine abuse ([place]) 06/02/2016   Essential hypertension 06/02/2016    Past Surgical History:  Procedure Laterality Date   APPENDECTOMY  1994   COLONOSCOPY WITH PROPOFOL N/A 02/03/2015   Procedure: COLONOSCOPY WITH PROPOFOL;  Surgeon: Hulen Luster, MD;  Location: [facility] ENDOSCOPY;  Service: Gastroenterology;  Laterality: N/A;    ESOPHAGOGASTRODUODENOSCOPY (EGD) WITH PROPOFOL N/A 02/03/2015   Procedure: ESOPHAGOGASTRODUODENOSCOPY (EGD) WITH PROPOFOL;  Surgeon: Hulen Luster, MD;  Location: [facility] ENDOSCOPY;  Service: Gastroenterology;  Laterality: N/A;   GALLBLADDER SURGERY  1989    OB History   No obstetric history on file.      Home Medications    Prior to Admission medications   Medication Sig Start Date End Date Taking? Authorizing Provider  ACCU-CHEK AVIVA PLUS test strip USE TO TEST BLOOD SUGAR EVERY MORNING AND 2HRS AFTER DINNER 06/16/17   Pleas Koch, NP  atorvastatin (LIPITOR) 20 MG tablet TAKE 1 TABLET BY MOUTH ONCE DAILY FOR CHOLESTEROL 06/20/20   Pleas Koch, NP  blood glucose meter kit and supplies KIT Test every morning before breakfast and 2 hours after dinner. 05/19/17   Pleas Koch, NP  ferrous sulfate 325 (65 FE) MG tablet Take by mouth.    [provider]  lidocaine (LIDODERM) 5 % PLEASE SEE ATTACHED FOR DETAILED DIRECTIONS 03/30/21   [provider]  mirtazapine (REMERON) 45 MG tablet Take 1 tablet (45 mg total) by mouth at bedtime. For sleep. 05/19/20   Pleas Koch, NP  pantoprazole (PROTONIX) 40 MG tablet Take 1 tablet by mouth at bedtime. Patient not taking: Reported on 05/06/2021 03/30/21 03/30/22  [provider]  sertraline (ZOLOFT) 100 MG tablet TAKE 1 TABLET BY MOUTH DAILY FOR ANXIETYAND DEPRESSION 06/20/20   Pleas Koch, NP  valsartan (DIOVAN) 40 MG tablet Take  1 tablet (40 mg total) by mouth daily. For blood pressure. 05/05/20   Pleas Koch, NP    Family History Family History  Problem Relation Age of Onset   Arthritis Mother    Hyperlipidemia Mother    Hypertension Mother    Mental illness Mother    Diabetes Mother    Arthritis Father    Hypertension Father    Mental illness Father    Alcohol abuse Maternal Grandmother    Learning disabilities Maternal Grandmother    Learning disabilities Maternal Grandfather      Social History Social History   Tobacco Use   Smoking status: Every Day    Packs/day: 1.00    Types: Cigarettes   Smokeless tobacco: Never  Substance Use Topics   Alcohol use: Yes    Comment: occasional    Drug use: Yes     Allergies   Lisinopril   Review of Systems Review of Systems  Constitutional:  Negative for chills and fever.  Respiratory:  Negative for cough and shortness of breath.   Cardiovascular:  Negative for chest pain and palpitations.  Musculoskeletal:        Swelling of left arm.  Skin:  Negative for color change and rash.  Neurological:  Negative for weakness and numbness.  All other systems reviewed and are negative.   Physical Exam Triage Vital Signs ED Triage Vitals  Enc Vitals Group     BP      Pulse      Resp      Temp      Temp src      SpO2      Weight      Height      Head Circumference      Peak Flow      Pain Score      Pain Loc      Pain Edu?      Excl. in [place]?    No data found.  Updated Vital Signs BP 129/71    Pulse 98    Temp 98.8 F (37.1 C)    Resp 16    SpO2 97%   Visual Acuity Right Eye Distance:   Left Eye Distance:   Bilateral Distance:    Right Eye Near:   Left Eye Near:    Bilateral Near:     Physical Exam Vitals and nursing note reviewed.  Constitutional:      General: She is not in acute distress.    Appearance: She is well-developed.  HENT:     Mouth/Throat:     Mouth: Mucous membranes are moist.  Cardiovascular:     Rate and Rhythm: Normal rate and regular rhythm.     Heart sounds: Normal heart sounds.  Pulmonary:     Effort: Pulmonary effort is normal. No respiratory distress.     Breath sounds: Normal breath sounds.  Musculoskeletal:        General: Swelling present.     Cervical back: Neck supple.     Comments: Left arm acutely edematous from elbow to hand. No generalized erythema. No ecchymosis.  2+ pulses, sensation intact.   Skin:    General: Skin is warm and dry.     Capillary  Refill: Capillary refill takes less than 2 seconds.     Comments: Two abrasions on left forearm.   Neurological:     General: No focal deficit present.     Mental Status: She is alert and oriented to person, place,  and time.     Sensory: No sensory deficit.     Motor: No weakness.     Gait: Gait normal.  Psychiatric:        Mood and Affect: Mood normal.        Behavior: Behavior normal.     UC Treatments / Results  Labs (all labs ordered are listed, but only abnormal results are displayed) Labs Reviewed - No data to display  EKG   Radiology No results found.  Procedures Procedures (including critical care time)  Medications Ordered in UC Medications - No data to display  Initial Impression / Assessment and Plan / UC Course  I have reviewed the triage vital signs and the nursing notes.  Pertinent labs & imaging results that were available during my care of the patient were reviewed by me and considered in my medical decision making (see chart for details).    Left arm swelling.  No chest pain or SOB.  No acute distress.  Discussed concern for possible DVT.  Patient declines outpatient ultrasound and states she will go to [facility] ED for evaluation because she had her ostomy surgery there and feels most comfortable being seen there.  Her daughter is with her and will drive her to the ED.    Final Clinical Impressions(s) / UC Diagnoses   Final diagnoses:  Left arm swelling     Discharge Instructions      Go to the emergency department for evaluation of your left arm swelling.       ED Prescriptions   None    PDMP not reviewed this encounter.   Sharion Balloon, NP 05/07/21 [phone]

## 2021-05-26 ENCOUNTER — Other Ambulatory Visit: Payer: Self-pay | Admitting: Primary Care

## 2021-05-26 DIAGNOSIS — E785 Hyperlipidemia, unspecified: Secondary | ICD-10-CM

## 2021-05-27 ENCOUNTER — Other Ambulatory Visit: Payer: Self-pay

## 2021-05-27 ENCOUNTER — Encounter: Payer: Self-pay | Admitting: Primary Care

## 2021-05-27 ENCOUNTER — Telehealth: Payer: Self-pay | Admitting: Pharmacist

## 2021-05-27 ENCOUNTER — Ambulatory Visit (INDEPENDENT_AMBULATORY_CARE_PROVIDER_SITE_OTHER): Payer: Self-pay | Admitting: Primary Care

## 2021-05-27 DIAGNOSIS — E119 Type 2 diabetes mellitus without complications: Secondary | ICD-10-CM

## 2021-05-27 DIAGNOSIS — I82622 Acute embolism and thrombosis of deep veins of left upper extremity: Secondary | ICD-10-CM | POA: Insufficient documentation

## 2021-05-27 DIAGNOSIS — Z8719 Personal history of other diseases of the digestive system: Secondary | ICD-10-CM

## 2021-05-27 DIAGNOSIS — Z9049 Acquired absence of other specified parts of digestive tract: Secondary | ICD-10-CM

## 2021-05-27 HISTORY — DX: Acute embolism and thrombosis of deep veins of left upper extremity: I82.622

## 2021-05-27 NOTE — Assessment & Plan Note (Addendum)
Originated from subclavian, also an axillary and brachial veins.  ED notes, labs, imaging reviewed from care everywhere, and urgent care notes reviewed from epic.  Continue Eliquis 5 mg twice daily for at least 3 months.  This was a provoked DVT since she underwent surgery in December 2022.  She has no history of atrial fibrillation and no evidence of such from EKGs or on exam.   Our pharmacist today provided her with a coupon card for the next month.  Patient is working with social work to obtain [employer].  Continue to closely monitor.

## 2021-05-27 NOTE — Assessment & Plan Note (Signed)
Overall appears stable.  Following with surgery through [place].

## 2021-05-27 NOTE — Progress Notes (Signed)
Subjective:    Patient ID: SAREA FYFE, female    DOB: 1960-04-15, 61 y.o.   MRN: [id number]  HPI  ALLEYNE LAC is a very pleasant 61 y.o. female with a history of hypertension, diverticulitis with perforation, type 2 diabetes, hyperlipidemia, iron deficiency anemia, left upper extremity swelling who presents today for urgent care and ED follow-up.  She was initially evaluated virtually on 05/06/2021 for a 3 to 4-day history of left upper extremity swelling from the proximal elbow through the lower forearm.  She was also status post abdominal surgery from diverticulitis in December 2022.  We strongly recommended she be seen in person, she could not get a ride.  Exam was limited during this visit, however a blood clot was suspected.  It was also recommended that she undergo ultrasound to evaluate for DVT, she refused.  She presented to the urgent care on 05/07/2021 for continued left upper extremity swelling.  Again the concern was for DVT given her recent outpatient surgery.  It was recommended that she proceed to the ED for evaluation.  She presented to the [facility] ED on 05/07/2021.  Exam and HPI were suspicious for DVT so she underwent left upper extremity venous ultrasound.  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  She was also noted to have a superficial venous thrombosis to the left basilic vein.  Given these findings she was treated with Eliquis 5 mg twice daily and told to follow-up with PCP.  Today she is compliant to Eliquis 5 mg BID, but it cost her $600. She has contacted her surgeon's office regarding her DVT, she hasn't heard anything back. Her home health nurse visited her yesterday, noticed some leakage to one of her lower incisions. Her left arm swelling was reduced. She is feeling much better overall.   She denies fevers.   She is checking her glucose levels which are running  90's-110's. Highest reading was 130.  She is no longer managed on insulin or metformin.  She is working to get on Disability through [employer]. She is needing a letter from [place] stating that she is our patient and to include what we are treating her for. She has an appointment scheduled with the social security office. She cannot work due to her ostomy bag, fatigue, recent significant abdominal surgery.    Review of Systems  Constitutional:  Positive for fatigue.  Respiratory:  Negative for shortness of breath.   Cardiovascular:  Negative for chest pain.  Gastrointestinal:  Negative for abdominal pain.  Skin:  Negative for color change.  Neurological:  Negative for dizziness.        Past Medical History:  Diagnosis Date   Acute diverticulitis 11/22/2018   Arthritis    Depression    Diabetes mellitus without complication (HCC)    Diverticulitis    Esophageal ulcer    GAD (generalized anxiety disorder)    GERD (gastroesophageal reflux disease)    Hyperlipidemia    Hypertension     Social History   Socioeconomic History   Marital status: Married    Spouse name: Not on file   Number of children: Not on file   Years of education: Not on file   Highest education level: Not on file  Occupational History   Not on file  Tobacco Use   Smoking status: Every Day    Packs/day: 1.00    Types: Cigarettes   Smokeless  tobacco: Never  Substance and Sexual Activity   Alcohol use: Yes    Comment: occasional    Drug use: Yes   Sexual activity: Not on file  Other Topics Concern   Not on file  Social History Narrative   Not on file   Social Determinants of Health   Financial Resource Strain: Not on file  Food Insecurity: Not on file  Transportation Needs: Not on file  Physical Activity: Not on file  Stress: Not on file  Social Connections: Not on file  Intimate Partner Violence: Not on file    Past Surgical History:  Procedure Laterality Date   APPENDECTOMY  1994   COLONOSCOPY  WITH PROPOFOL N/A 02/03/2015   Procedure: COLONOSCOPY WITH PROPOFOL;  Surgeon: Hulen Luster, MD;  Location: [facility] ENDOSCOPY;  Service: Gastroenterology;  Laterality: N/A;   ESOPHAGOGASTRODUODENOSCOPY (EGD) WITH PROPOFOL N/A 02/03/2015   Procedure: ESOPHAGOGASTRODUODENOSCOPY (EGD) WITH PROPOFOL;  Surgeon: Hulen Luster, MD;  Location: [facility] ENDOSCOPY;  Service: Gastroenterology;  Laterality: N/A;   GALLBLADDER SURGERY  1989    Family History  Problem Relation Age of Onset   Arthritis Mother    Hyperlipidemia Mother    Hypertension Mother    Mental illness Mother    Diabetes Mother    Arthritis Father    Hypertension Father    Mental illness Father    Alcohol abuse Maternal Grandmother    Learning disabilities Maternal Grandmother    Learning disabilities Maternal Grandfather     Allergies  Allergen Reactions   Lisinopril Swelling    Tongue Swelling    Current Outpatient Medications on File Prior to Visit  Medication Sig Dispense Refill   ACCU-CHEK AVIVA PLUS test strip USE TO TEST BLOOD SUGAR EVERY MORNING AND 2HRS AFTER DINNER 100 each 1   apixaban (ELIQUIS) 5 MG TABS tablet Take by mouth.     atorvastatin (LIPITOR) 20 MG tablet TAKE 1 TABLET BY MOUTH ONCE DAILY FOR CHOLESTEROL 90 tablet 3   blood glucose meter kit and supplies KIT Test every morning before breakfast and 2 hours after dinner. 1 each 0   ferrous sulfate 325 (65 FE) MG tablet Take by mouth.     mirtazapine (REMERON) 45 MG tablet Take 1 tablet (45 mg total) by mouth at bedtime. For depression and sleep. 90 tablet 1   sertraline (ZOLOFT) 100 MG tablet TAKE 1 TABLET BY MOUTH DAILY FOR ANXIETYAND DEPRESSION 90 tablet 3   valsartan (DIOVAN) 40 MG tablet TAKE 1 TABLET BY MOUTH ONCE DAILY FOR BLOOD PRESSURE 90 tablet 1   lidocaine (LIDODERM) 5 % PLEASE SEE ATTACHED FOR DETAILED DIRECTIONS (Patient not taking: Reported on 05/27/2021)     pantoprazole (PROTONIX) 40 MG tablet Take 1 tablet by mouth at bedtime. (Patient not taking:  Reported on 05/06/2021)     No current facility-administered medications on file prior to visit.    BP 118/64    Pulse 78    Temp 98.6 F (37 C) (Oral)    Ht '5\' 5"'  (1.651 m)    Wt 139 lb (63 kg)    SpO2 100%    BMI 23.13 kg/m  Objective:   Physical Exam Cardiovascular:     Rate and Rhythm: Normal rate and regular rhythm.     Comments: Left upper extremity with significant improvement and reduction in swelling. No erythema. Pulmonary:     Effort: Pulmonary effort is normal.     Breath sounds: Normal breath sounds.  Abdominal:     Palpations:  Abdomen is soft.     Comments: Ostomy bag intact  Musculoskeletal:     Cervical back: Neck supple.  Skin:    General: Skin is warm and dry.     Findings: No erythema.     Comments: No leakage noted from abdominal incisions. No erythema or warmth around incision sites.          Assessment & Plan:      This visit occurred during the SARS-CoV-2 public health emergency.  Safety protocols were in place, including screening questions prior to the visit, additional usage of staff PPE, and extensive cleaning of exam room while observing appropriate contact time as indicated for disinfecting solutions.

## 2021-05-27 NOTE — Patient Instructions (Addendum)
Continue the Eliquis 5 mg twice daily for blood clot prevention.  Continue to monitor your blood sugars, notify me if you begin to notice readings at or above 150 consistently.  We will see you in April as scheduled.  It was a pleasure to see you today!

## 2021-05-27 NOTE — Assessment & Plan Note (Signed)
Appears controlled based on glucose readings.  Continue off of insulin and metformin.  Repeat A1c at next visit.  A1c of 6.1 from mid December 2022, reviewed in care everywhere.

## 2021-05-27 NOTE — Assessment & Plan Note (Signed)
With perforation and significant abdominal surgery.  Following with surgery through [facility].  Agree to provide letter to support her disability claim.

## 2021-05-27 NOTE — Telephone Encounter (Signed)
Patient is not insurance and will be on Eliquis for at least 3 months for DVT.  Provided patient with 30-day free trial card which she can take to pharmacy for her next refill. Also started [employer] patient assistance paperwork - since she is not insured she may qualify depending on income requirements (which are not published). She reports her husband and her combined income is about $60,000 annually.  Patient will bring proof of income and Rx costs back to office to complete application.

## 2021-05-28 ENCOUNTER — Other Ambulatory Visit: Payer: Self-pay | Admitting: Primary Care

## 2021-05-28 DIAGNOSIS — I82622 Acute embolism and thrombosis of deep veins of left upper extremity: Secondary | ICD-10-CM

## 2021-05-28 MED ORDER — APIXABAN 5 MG PO TABS: 5.0000 mg | ORAL_TABLET | Freq: Two times a day (BID) | ORAL | 1 refills | Status: DC

## 2021-06-29 ENCOUNTER — Other Ambulatory Visit: Payer: Self-pay | Admitting: Primary Care

## 2021-06-29 DIAGNOSIS — F419 Anxiety disorder, unspecified: Secondary | ICD-10-CM

## 2021-07-02 ENCOUNTER — Telehealth: Payer: Self-pay

## 2021-07-02 NOTE — Progress Notes (Signed)
? ? ?  Chronic Care Management ?Pharmacy Assistant  ? ?Name: AVIAH SORCI  MRN: [id number] DOB: 09-10-1960 ? ?Reason for Encounter: Non-CCM (Eliquis 2023 PAP Approved) ?  ?Patient has been approved for Eliquis patient assistance through [employer] for 2023. ? ?Al Corpus, CPP notified ? ?Claudina Lick, RMA ?Clinical Pharmacy Assistant ?[phone] ? ? ?

## 2021-07-02 NOTE — Progress Notes (Signed)
Contacted [employer] for assistance update on Eliquis. Application approved 06/10/21 and delivery to the patient was 06/25/21. ? ?Al Corpus, CPP notified ? ?Connor Meacham, CCMA ?Health concierge  ?[phone]  ?

## 2021-07-02 NOTE — Progress Notes (Signed)
Patient has been approved for Eliquis patient assistance through [employer] for 2023. ? ?Al Corpus, CPP notified ? ?Claudina Lick, RMA ?Clinical Pharmacy Assistant ?[phone] ? ? ? ?

## 2021-07-14 ENCOUNTER — Ambulatory Visit: Payer: Self-pay | Admitting: Primary Care

## 2021-08-06 ENCOUNTER — Other Ambulatory Visit: Payer: Self-pay | Admitting: Primary Care

## 2021-08-06 DIAGNOSIS — E785 Hyperlipidemia, unspecified: Secondary | ICD-10-CM

## 2021-08-07 NOTE — Telephone Encounter (Signed)
Would like to see patient in August for follow up. ?Please schedule. ? ?Also find out if she talked to her surgeon about her Eliquis. She was supposed to stop after April. ?

## 2021-08-19 NOTE — Telephone Encounter (Signed)
Called patient she stopped eliquis on May 9th. F/u made with patient  ?

## 2021-10-08 ENCOUNTER — Telehealth (INDEPENDENT_AMBULATORY_CARE_PROVIDER_SITE_OTHER): Payer: Self-pay | Admitting: Nurse Practitioner

## 2021-10-08 ENCOUNTER — Encounter: Payer: Self-pay | Admitting: Nurse Practitioner

## 2021-10-08 ENCOUNTER — Telehealth: Payer: Self-pay | Admitting: Nurse Practitioner

## 2021-10-08 VITALS — BP 118/58 | Ht 65.0 in | Wt 139.0 lb

## 2021-10-08 DIAGNOSIS — U071 COVID-19: Secondary | ICD-10-CM | POA: Insufficient documentation

## 2021-10-08 HISTORY — DX: COVID-19: U07.1

## 2021-10-08 MED ORDER — NIRMATRELVIR/RITONAVIR (PAXLOVID)TABLET: 3.0000 | ORAL_TABLET | Freq: Two times a day (BID) | ORAL | 0 refills | Status: AC

## 2021-10-08 NOTE — Telephone Encounter (Signed)
FYI patient states that she is no longer on the blood thinner. States they took her off in the hospital

## 2021-10-08 NOTE — Assessment & Plan Note (Signed)
Discussed antiviral treatment with patient.  Discussed availability EUA only.  After discussion patient elected to be treated with antiviral medication.  Did discuss possible side effects of medication chosen.  Did discuss signs and symptoms when to seek urgent or emergent health care.  Also discussed self-isolation/quarantine guidelines in regards to the [facility] recommendations.  Patient will hold her atorvastatin 20 mg for 2 weeks.  Follow-up if no improvement

## 2021-10-08 NOTE — Telephone Encounter (Signed)
Noted  

## 2021-10-08 NOTE — Progress Notes (Signed)
Patient ID: Morgan Small, female    DOB: September 30, 1960, 61 y.o.   MRN: [id number]  Virtual visit completed through [place], a video enabled telemedicine application. Due to national recommendations of social distancing due to COVID-19, a virtual visit is felt to be most appropriate for this patient at this time. Reviewed limitations, risks, security and privacy concerns of performing a virtual visit and the availability of in person appointments. I also reviewed that there may be a patient responsible charge related to this service. The patient agreed to proceed.   Patient location: home Provider location: [place] at [facility], office Persons participating in this virtual visit: patient, provider   Attempted to connect via video enabled device but was unsuccessful.  We reverted to telephone encounter Phone call lasted: 9 minutes and 11 seconds  If any vitals were documented, they were collected by patient at home unless specified below.    BP (!) 118/58   Ht '5\' 5"'  (1.651 m)   Wt 139 lb (63 kg)   BMI 23.13 kg/m    CC: Covid 19 Subjective:   HPI: Morgan Small is a 61 y.o. female presenting on 10/08/2021 for Covid Positive (Tested yesterday)  Symptoms started on 10/07/2021  Covid test was yesterday that was positive Pfizer x2 covid vaccine Husband is the sick contact She is taking tylenol. With relief    Relevant past medical, surgical, family and social history reviewed and updated as indicated. Interim medical history since our last visit reviewed. Allergies and medications reviewed and updated. Outpatient Medications Prior to Visit  Medication Sig Dispense Refill   ACCU-CHEK AVIVA PLUS test strip USE TO TEST BLOOD SUGAR EVERY MORNING AND 2HRS AFTER DINNER 100 each 1   atorvastatin (LIPITOR) 20 MG tablet TAKE 1 TABLET BY MOUTH ONCE DAILY FOR CHOLESTEROL 90 tablet 0   blood glucose meter kit and supplies KIT Test every morning before breakfast and 2 hours after dinner. 1  each 0   lidocaine (LIDODERM) 5 %      mirtazapine (REMERON) 45 MG tablet Take 1 tablet (45 mg total) by mouth at bedtime. For depression and sleep. 90 tablet 1   pantoprazole (PROTONIX) 40 MG tablet Take 1 tablet by mouth at bedtime.     sertraline (ZOLOFT) 100 MG tablet TAKE 1 TABLET BY MOUTH DAILY FOR ANXIETYAND DEPRESSION 90 tablet 0   ferrous sulfate 325 (65 FE) MG tablet Take by mouth. (Patient not taking: Reported on 10/08/2021)     valsartan (DIOVAN) 40 MG tablet TAKE 1 TABLET BY MOUTH ONCE DAILY FOR BLOOD PRESSURE (Patient not taking: Reported on 10/08/2021) 90 tablet 1   apixaban (ELIQUIS) 5 MG TABS tablet Take 1 tablet (5 mg total) by mouth 2 (two) times daily. For blood clot prevention (Patient not taking: Reported on 10/08/2021) 60 tablet 1   No facility-administered medications prior to visit.     Per HPI unless specifically indicated in ROS section below Review of Systems  Constitutional:  Positive for appetite change, chills and fatigue. Negative for fever.  HENT:  Positive for rhinorrhea. Negative for ear discharge, ear pain, postnasal drip, sinus pressure, sinus pain and sore throat.   Respiratory:  Positive for cough. Negative for shortness of breath.   Gastrointestinal:  Positive for nausea. Negative for abdominal pain and diarrhea.  Musculoskeletal:  Positive for arthralgias and myalgias.  Neurological:  Positive for headaches.   Objective:  BP (!) 118/58   Ht '5\' 5"'  (1.651 m)   Wt 139  lb (63 kg)   BMI 23.13 kg/m   Wt Readings from Last 3 Encounters:  10/08/21 139 lb (63 kg)  05/27/21 139 lb (63 kg)  05/06/21 145 lb (65.8 kg)       Physical exam: Gen: alert, NAD, not ill appearing Pulm: speaks in complete sentences without increased work of breathing Psych: normal mood, normal thought content      Results for orders placed or performed in visit on 59/16/38  Basic metabolic panel  Result Value Ref Range   Sodium 138 135 - 145 mEq/L   Potassium 3.7 3.5 - 5.1  mEq/L   Chloride 105 96 - 112 mEq/L   CO2 25 19 - 32 mEq/L   Glucose, Bld 118 (H) 70 - 99 mg/dL   BUN 12 6 - 23 mg/dL   Creatinine, Ser 0.71 0.40 - 1.20 mg/dL   GFR 91.57 >60.00 mL/min   Calcium 7.9 (L) 8.4 - 10.5 mg/dL  CBC  Result Value Ref Range   WBC 8.4 4.0 - 10.5 K/uL   RBC 3.97 3.87 - 5.11 Mil/uL   Platelets 297.0 150.0 - 400.0 K/uL   Hemoglobin 10.1 (L) 12.0 - 15.0 g/dL   HCT 32.3 (L) 36.0 - 46.0 %   MCV 81.2 78.0 - 100.0 fl   MCHC 31.3 30.0 - 36.0 g/dL   RDW 18.1 (H) 11.5 - 15.5 %  IBC + Ferritin  Result Value Ref Range   Iron 24 (L) 42 - 145 ug/dL   Transferrin 170.0 (L) 212.0 - 360.0 mg/dL   Saturation Ratios 10.1 (L) 20.0 - 50.0 %   Ferritin 104.5 10.0 - 291.0 ng/mL   TIBC 238.0 (L) 250.0 - 450.0 mcg/dL   Assessment & Plan:   Problem List Items Addressed This Visit       Other   COVID-19 - Primary    Discussed antiviral treatment with patient.  Discussed availability EUA only.  After discussion patient elected to be treated with antiviral medication.  Did discuss possible side effects of medication chosen.  Did discuss signs and symptoms when to seek urgent or emergent health care.  Also discussed self-isolation/quarantine guidelines in regards to the [facility] recommendations.  Patient will hold her atorvastatin 20 mg for 2 weeks.  Follow-up if no improvement      Relevant Medications   nirmatrelvir/ritonavir EUA (PAXLOVID) 20 x 150 MG & 10 x 100MG TABS     Meds ordered this encounter  Medications   nirmatrelvir/ritonavir EUA (PAXLOVID) 20 x 150 MG & 10 x 100MG TABS    Sig: Take 3 tablets by mouth 2 (two) times daily for 5 days. (Take nirmatrelvir 150 mg two tablets twice daily for 5 days and ritonavir 100 mg one tablet twice daily for 5 days) Patient GFR is 101. Hold Atorvastatin (Lipitor) for two weeks    Dispense:  30 tablet    Refill:  0    Order Specific Question:   Supervising Provider    Answer:   TOWER, MARNE A [[id number]]   No orders of the defined types  were placed in this encounter.   I discussed the assessment and treatment plan with the patient. The patient was provided an opportunity to ask questions and all were answered. The patient agreed with the plan and demonstrated an understanding of the instructions. The patient was advised to call back or seek an in-person evaluation if the symptoms worsen or if the condition fails to improve as anticipated.  Follow up plan: Return if symptoms  worsen or fail to improve.  Romilda Garret, NP

## 2021-10-08 NOTE — Patient Instructions (Signed)
Nice to talk to you today HOLD your Atorvastatin (Lipitor) for 2 weeks while on Paxlovid

## 2021-10-22 ENCOUNTER — Other Ambulatory Visit: Payer: Self-pay | Admitting: Primary Care

## 2021-10-22 DIAGNOSIS — F32A Depression, unspecified: Secondary | ICD-10-CM

## 2021-10-30 ENCOUNTER — Inpatient Hospital Stay: Payer: Self-pay | Admitting: Primary Care

## 2021-11-06 ENCOUNTER — Ambulatory Visit (INDEPENDENT_AMBULATORY_CARE_PROVIDER_SITE_OTHER): Payer: Self-pay | Admitting: Primary Care

## 2021-11-06 ENCOUNTER — Encounter: Payer: Self-pay | Admitting: Primary Care

## 2021-11-06 VITALS — BP 118/62 | HR 83 | Temp 98.6°F | Ht 65.0 in | Wt 132.0 lb

## 2021-11-06 DIAGNOSIS — E785 Hyperlipidemia, unspecified: Secondary | ICD-10-CM

## 2021-11-06 DIAGNOSIS — D509 Iron deficiency anemia, unspecified: Secondary | ICD-10-CM

## 2021-11-06 DIAGNOSIS — E1165 Type 2 diabetes mellitus with hyperglycemia: Secondary | ICD-10-CM

## 2021-11-06 DIAGNOSIS — F419 Anxiety disorder, unspecified: Secondary | ICD-10-CM

## 2021-11-06 DIAGNOSIS — I10 Essential (primary) hypertension: Secondary | ICD-10-CM

## 2021-11-06 DIAGNOSIS — F32A Depression, unspecified: Secondary | ICD-10-CM

## 2021-11-06 DIAGNOSIS — Z9889 Other specified postprocedural states: Secondary | ICD-10-CM | POA: Insufficient documentation

## 2021-11-06 LAB — LIPID PANEL
Cholesterol: 118 mg/dL (ref 0–200)
HDL: 13.7 mg/dL — ABNORMAL LOW (ref >39.00)
LDL Cholesterol: 89 mg/dL (ref 0–99)
NonHDL: 103.98
Total CHOL/HDL Ratio: 9
Triglycerides: 76 mg/dL (ref 0.0–149.0)
VLDL: 15.2 mg/dL (ref 0.0–40.0)

## 2021-11-06 LAB — COMPREHENSIVE METABOLIC PANEL
ALT: 62 U/L — ABNORMAL HIGH (ref 0–35)
AST: 143 U/L — ABNORMAL HIGH (ref 0–37)
Albumin: 2.7 g/dL — ABNORMAL LOW (ref 3.5–5.2)
Alkaline Phosphatase: 425 U/L — ABNORMAL HIGH (ref 39–117)
BUN: 8 mg/dL (ref 6–23)
CO2: 23 mEq/L (ref 19–32)
Calcium: 8.2 mg/dL — ABNORMAL LOW (ref 8.4–10.5)
Chloride: 106 mEq/L (ref 96–112)
Creatinine, Ser: 0.65 mg/dL (ref 0.40–1.20)
GFR: 94.44 mL/min (ref >60.00)
Glucose, Bld: 92 mg/dL (ref 70–99)
Potassium: 3.7 mEq/L (ref 3.5–5.1)
Sodium: 138 mEq/L (ref 135–145)
Total Bilirubin: 3.2 mg/dL — ABNORMAL HIGH (ref 0.2–1.2)
Total Protein: 7.3 g/dL (ref 6.0–8.3)

## 2021-11-06 LAB — IBC + FERRITIN
Ferritin: 114.1 ng/mL (ref 10.0–291.0)
Iron: 97 ug/dL (ref 42–145)
Saturation Ratios: 47.5 % (ref 20.0–50.0)
TIBC: 204.4 ug/dL — ABNORMAL LOW (ref 250.0–450.0)
Transferrin: 146 mg/dL — ABNORMAL LOW (ref 212.0–360.0)

## 2021-11-06 LAB — CBC
HCT: 32.3 % — ABNORMAL LOW (ref 36.0–46.0)
Hemoglobin: 10.7 g/dL — ABNORMAL LOW (ref 12.0–15.0)
MCHC: 33.2 g/dL (ref 30.0–36.0)
MCV: 93.5 fl (ref 78.0–100.0)
Platelets: 128 10*3/uL — ABNORMAL LOW (ref 150.0–400.0)
RBC: 3.45 Mil/uL — ABNORMAL LOW (ref 3.87–5.11)
RDW: 16.8 % — ABNORMAL HIGH (ref 11.5–15.5)
WBC: 6 10*3/uL (ref 4.0–10.5)

## 2021-11-06 LAB — HEMOGLOBIN A1C: Hgb A1c MFr Bld: 5.7 % (ref 4.6–6.5)

## 2021-11-06 NOTE — Assessment & Plan Note (Signed)
Remain off of ferrous sulfate for now.  Repeat iron studies and CBC pending.

## 2021-11-06 NOTE — Assessment & Plan Note (Signed)
Repeat A1c pending today.  Remain off medications.

## 2021-11-06 NOTE — Progress Notes (Signed)
Subjective:    Patient ID: Morgan Small, female    DOB: Nov 27, 1960, 61 y.o.   MRN: [id number]  HPI  Morgan Small is a very pleasant 61 y.o. female with a history of hypertension, type 2 diabetes, hyperlipidemia, anxiety depression, diverticulitis, partial colectomy who presents today for follow up of chronic conditions.  1) Type 2 Diabetes:  Current medications include: None.  She is checking her blood glucose 0 times daily.  Last A1C: 6.1 in April 2023 Last Eye Exam: Due Last Foot Exam: Due Pneumonia Vaccination: 218 Urine Microalbumin: None. Historically managed on ARB Statin: atorvastatin    2) History of Diverticulitis/Colectomy: Admitted to [facility] on 08/29/2021 through 6/44/0347 for complications post colectomy takedown.  She underwent colectomy takedown on 08/18/2021.    Complications included drainage from the midline laparotomy incision, decreased appetite, abdominal distention and discomfort, weakness, vomiting.  She was diagnosed with septic shock and transferred to the ICU.  Other complications included AKI, hypotension requiring vasopressors, malnutrition, elevated LFTs.  She was also found to have drug-induced liver injury secondary to Tylenol consumption.  She was discharged home on 09/18/2021 with recommendations to start ciprofloxacin antibiotics, Marinol capsules, metronidazole antibiotics, midodrine 3 times daily.  Since her discharge she's feeling better overall. She continues to notice imbalance. Her appetite has improved, she is cooking a lot. She participated in home health physical for several weeks, has completed. She is visited by home health nursing for wound management.   She and her husband pack and redress her wound daily. She has one remaining wound. She's been told that her wound is healing correctly. She has yet to see her surgeon has she contracted Covid-19 infection in early July 2023.   It was recommended she undergo liver biopsy but this was  put on hold given her admission for septic shock. She continues to take Tylenol on occasion for a headache.   3) Essential Hypertension: Previously managed on Valsartan 40 mg daily but this was discontinued during her last hospital stay for hypotension and septic shock. She checks her BP at home which runs in the 110's/60's-70's.   She denies chest pain, shortness of breath.   BP Readings from Last 3 Encounters:  11/06/21 118/62  10/08/21 (!) 118/58  05/27/21 118/64     Review of Systems  Constitutional:  Negative for fever.  Respiratory:  Negative for shortness of breath.   Cardiovascular:  Negative for chest pain.  Gastrointestinal:  Negative for blood in stool, constipation and diarrhea.  Skin:  Negative for color change.  Neurological:  Negative for dizziness.         Past Medical History:  Diagnosis Date   Acute deep vein thrombosis (DVT) of left upper extremity ([place]) 05/27/2021   Acute diverticulitis 11/22/2018   Arthritis    COVID-19 10/08/2021   Depression    Diabetes mellitus without complication (HCC)    Diverticulitis    Esophageal ulcer    GAD (generalized anxiety disorder)    GERD (gastroesophageal reflux disease)    Hyperlipidemia    Hypertension    S/P partial colectomy 04/14/2021    Social History   Socioeconomic History   Marital status: Married    Spouse name: Not on file   Number of children: Not on file   Years of education: Not on file   Highest education level: Not on file  Occupational History   Not on file  Tobacco Use   Smoking status: Every Day    Packs/day: 1.00  Types: Cigarettes   Smokeless tobacco: Never  Substance and Sexual Activity   Alcohol use: Yes    Comment: occasional    Drug use: Yes   Sexual activity: Not on file  Other Topics Concern   Not on file  Social History Narrative   Not on file   Social Determinants of Health   Financial Resource Strain: Not on file  Food Insecurity: Not on file  Transportation  Needs: Not on file  Physical Activity: Not on file  Stress: Not on file  Social Connections: Not on file  Intimate Partner Violence: Not on file    Past Surgical History:  Procedure Laterality Date   APPENDECTOMY  1994   COLONOSCOPY WITH PROPOFOL N/A 02/03/2015   Procedure: COLONOSCOPY WITH PROPOFOL;  Surgeon: Hulen Luster, MD;  Location: [facility] ENDOSCOPY;  Service: Gastroenterology;  Laterality: N/A;   ESOPHAGOGASTRODUODENOSCOPY (EGD) WITH PROPOFOL N/A 02/03/2015   Procedure: ESOPHAGOGASTRODUODENOSCOPY (EGD) WITH PROPOFOL;  Surgeon: Hulen Luster, MD;  Location: [facility] ENDOSCOPY;  Service: Gastroenterology;  Laterality: N/A;   GALLBLADDER SURGERY  1989    Family History  Problem Relation Age of Onset   Arthritis Mother    Hyperlipidemia Mother    Hypertension Mother    Mental illness Mother    Diabetes Mother    Arthritis Father    Hypertension Father    Mental illness Father    Alcohol abuse Maternal Grandmother    Learning disabilities Maternal Grandmother    Learning disabilities Maternal Grandfather     Allergies  Allergen Reactions   Lisinopril Swelling    Tongue Swelling    Current Outpatient Medications on File Prior to Visit  Medication Sig Dispense Refill   ACCU-CHEK AVIVA PLUS test strip USE TO TEST BLOOD SUGAR EVERY MORNING AND 2HRS AFTER DINNER 100 each 1   atorvastatin (LIPITOR) 20 MG tablet TAKE 1 TABLET BY MOUTH ONCE DAILY FOR CHOLESTEROL 90 tablet 0   blood glucose meter kit and supplies KIT Test every morning before breakfast and 2 hours after dinner. 1 each 0   mirtazapine (REMERON) 45 MG tablet TAKE 1 TABLET BY MOUTH EVERY NIGHT AT BEDTIME FOR DEPRESSION AND SLEEP 90 tablet 0   pantoprazole (PROTONIX) 40 MG tablet Take 1 tablet by mouth at bedtime.     sertraline (ZOLOFT) 100 MG tablet TAKE 1 TABLET BY MOUTH DAILY FOR ANXIETYAND DEPRESSION 90 tablet 0   ferrous sulfate 325 (65 FE) MG tablet Take by mouth. (Patient not taking: Reported on 10/08/2021)     No  current facility-administered medications on file prior to visit.    BP 118/62   Pulse 83   Temp 98.6 F (37 C) (Oral)   Ht '5\' 5"'  (1.651 m)   Wt 132 lb (59.9 kg)   SpO2 99%   BMI 21.97 kg/m  Objective:   Physical Exam Cardiovascular:     Rate and Rhythm: Normal rate and regular rhythm.  Pulmonary:     Effort: Pulmonary effort is normal.     Breath sounds: Normal breath sounds.  Abdominal:     General: Bowel sounds are normal.     Palpations: Abdomen is soft.     Tenderness: There is no abdominal tenderness.  Musculoskeletal:     Cervical back: Neck supple.  Skin:    General: Skin is warm and dry.     Comments: Bandage intact to abdominal incision. No surrounding erythema or drainage.   Psychiatric:        Mood and Affect:  Mood normal.           Assessment & Plan:   Problem List Items Addressed This Visit       Cardiovascular and Mediastinum   Essential hypertension    Controlled.  Remain off of valsartan 40 mg daily. Continue to monitor blood pressures at home.      Relevant Orders   CBC     Endocrine   Type 2 diabetes mellitus with hyperglycemia (HCC)    Repeat A1c pending today.  Remain off medications.        Relevant Orders   Hemoglobin A1c     Other   Hyperlipidemia - Primary    Repeat lipid panel pending.  Continue atorvastatin 20 mg daily.      Relevant Orders   Lipid panel   Comprehensive metabolic panel   Anxiety and depression    Overall controlled.  Continue sertraline 100 mg daily and mirtazapine 45 mg at bedtime.      Iron deficiency anemia    Remain off of ferrous sulfate for now.  Repeat iron studies and CBC pending.      Relevant Orders   IBC + Ferritin   S/P colostomy takedown    Reviewed hospitalization notes, labs, imaging from [facility] through care everywhere from May/June 2023.  Repeat LFTs, A1c, iron studies, A1c pending.  Incisional site today appears to be healing well. Follow-up with surgeon as  scheduled.          Pleas Koch, NP

## 2021-11-06 NOTE — Assessment & Plan Note (Addendum)
Reviewed hospitalization notes, labs, imaging from [facility] through care everywhere from May/June 2023.  Repeat LFTs, A1c, iron studies, A1c pending.  Incisional site today appears to be healing well. Follow-up with surgeon as scheduled.

## 2021-11-06 NOTE — Assessment & Plan Note (Signed)
Overall controlled.  Continue sertraline 100 mg daily and mirtazapine 45 mg at bedtime.

## 2021-11-06 NOTE — Patient Instructions (Signed)
Stop by the lab prior to leaving today. I will notify you of your results once received.   It was a pleasure to see you today!  

## 2021-11-06 NOTE — Assessment & Plan Note (Signed)
Controlled.  Remain off of valsartan 40 mg daily. Continue to monitor blood pressures at home.

## 2021-11-06 NOTE — Assessment & Plan Note (Signed)
Repeat lipid panel pending.  Continue atorvastatin 20 mg daily. 

## 2021-11-11 ENCOUNTER — Ambulatory Visit: Payer: Self-pay | Admitting: Primary Care

## 2022-01-03 DEATH — deceased
